# Patient Record
Sex: Female | Born: 1970 | Race: White | Hispanic: No | Marital: Single | Smoking: Never smoker
Health system: Southern US, Community
[De-identification: ages and names within clinical notes are randomized; demographics above are authoritative.]

## PROBLEM LIST (undated history)

## (undated) DIAGNOSIS — K219 Gastro-esophageal reflux disease without esophagitis: Secondary | ICD-10-CM

## (undated) DIAGNOSIS — M797 Fibromyalgia: Secondary | ICD-10-CM

## (undated) DIAGNOSIS — M1990 Unspecified osteoarthritis, unspecified site: Secondary | ICD-10-CM

## (undated) DIAGNOSIS — R5383 Other fatigue: Secondary | ICD-10-CM

## (undated) DIAGNOSIS — I10 Essential (primary) hypertension: Secondary | ICD-10-CM

## (undated) DIAGNOSIS — M722 Plantar fascial fibromatosis: Secondary | ICD-10-CM

## (undated) HISTORY — DX: Fibromyalgia: M79.7

## (undated) HISTORY — DX: Other fatigue: R53.83

## (undated) HISTORY — DX: Unspecified osteoarthritis, unspecified site: M19.90

## (undated) HISTORY — DX: Essential (primary) hypertension: I10

## (undated) HISTORY — PX: FOOT SURGERY: SHX648

## (undated) HISTORY — DX: Plantar fascial fibromatosis: M72.2

## (undated) HISTORY — DX: Gastro-esophageal reflux disease without esophagitis: K21.9

---

## 2006-06-20 ENCOUNTER — Ambulatory Visit: Payer: Self-pay | Admitting: Cardiology

## 2006-06-27 ENCOUNTER — Ambulatory Visit: Payer: Self-pay | Admitting: Cardiology

## 2006-09-07 ENCOUNTER — Ambulatory Visit: Payer: Self-pay | Admitting: Cardiology

## 2006-09-11 ENCOUNTER — Ambulatory Visit: Payer: Self-pay | Admitting: Cardiovascular Disease

## 2006-09-11 ENCOUNTER — Inpatient Hospital Stay (HOSPITAL_BASED_OUTPATIENT_CLINIC_OR_DEPARTMENT_OTHER): Admission: RE | Admit: 2006-09-11 | Discharge: 2006-09-11 | Payer: Self-pay | Admitting: Internal Medicine

## 2006-09-21 ENCOUNTER — Ambulatory Visit: Payer: Self-pay | Admitting: Cardiology

## 2009-08-18 ENCOUNTER — Ambulatory Visit: Payer: Self-pay | Admitting: Cardiology

## 2016-09-14 ENCOUNTER — Telehealth (INDEPENDENT_AMBULATORY_CARE_PROVIDER_SITE_OTHER): Payer: Self-pay | Admitting: Radiology

## 2016-09-14 NOTE — Telephone Encounter (Signed)
[facility] Calling about prescription written back in August for ambien 10mg . The prescription was suppose to be under Dr. Bronson Curb no [place] PA-C. Advised looking in old EMR, I can see where this was approved by Dr.Deveshwar and message was taken by Amy.

## 2017-01-03 DIAGNOSIS — F5101 Primary insomnia: Secondary | ICD-10-CM | POA: Insufficient documentation

## 2017-01-03 DIAGNOSIS — M722 Plantar fascial fibromatosis: Secondary | ICD-10-CM | POA: Insufficient documentation

## 2017-01-03 DIAGNOSIS — R5383 Other fatigue: Secondary | ICD-10-CM | POA: Insufficient documentation

## 2017-01-03 DIAGNOSIS — M797 Fibromyalgia: Secondary | ICD-10-CM | POA: Insufficient documentation

## 2017-01-03 NOTE — Progress Notes (Signed)
Office Visit Note  Patient: Katie Branch             Date of Birth: [birth year]           MRN: [id number]             PCP: Monico Blitz, MD Referring: No ref. provider found Visit Date: 01/06/2017 Occupation: @GUAROCC @    Subjective:  Knee pain.   History of Present Illness: Katie Branch is a 46 y.o. female with history of fibromyalgia syndrome. She states she's been having increased discomfort in the last few weeks. She describes pain in bilateral knee joints and behind her knees. She's been also having discomfort in her bilateral lower extremities. She complains of increased neck pain and trapezius pain. She's been having increased migraine headaches. She's been noticing some numbness in her right hand. She denies any joint swelling. She describes her pain on 0-10 about 6 and fatigue about 8.   Activities of Daily Living:  Patient reports morning stiffness for 1 hour.   Patient Reports nocturnal pain.  Difficulty dressing/grooming: Denies Difficulty climbing stairs: Reports Difficulty getting out of chair: Reports Difficulty using hands for taps, buttons, cutlery, and/or writing: Denies   Review of Systems  Constitutional: Positive for fatigue. Negative for night sweats, weight gain, weight loss and weakness.  HENT: Positive for mouth dryness. Negative for mouth sores, trouble swallowing, trouble swallowing and nose dryness.   Eyes: Negative for pain, redness, visual disturbance and dryness.  Respiratory: Negative for cough, shortness of breath and difficulty breathing.   Cardiovascular: Negative for chest pain, palpitations, hypertension, irregular heartbeat and swelling in legs/feet.  Gastrointestinal: Negative for blood in stool, constipation and diarrhea.  Endocrine: Negative for increased urination.  Genitourinary: Negative for vaginal dryness.  Musculoskeletal: Positive for arthralgias, joint pain, myalgias, morning stiffness and myalgias. Negative for joint swelling,  muscle weakness and muscle tenderness.  Skin: Negative for color change, rash, hair loss, skin tightness, ulcers and sensitivity to sunlight.  Allergic/Immunologic: Negative for susceptible to infections.  Neurological: Positive for headaches. Negative for dizziness, memory loss and night sweats.  Hematological: Negative for swollen glands.  Psychiatric/Behavioral: Positive for depressed mood and sleep disturbance. The patient is nervous/anxious.     PMFS History:  Patient Active Problem List   Diagnosis Date Noted  . Essential hypertension 01/04/2017  . Gastroesophageal reflux disease without esophagitis 01/04/2017  . Fibromyalgia 01/03/2017  . Other fatigue 01/03/2017  . Primary insomnia 01/03/2017  . Plantar fasciitis 01/03/2017    Past Medical History:  Diagnosis Date  . Fatigue   . Fibromyalgia   . GERD (gastroesophageal reflux disease)   . Hypertension   . Plantar fasciitis     No family history on file. Past Surgical History:  Procedure Laterality Date  . FOOT SURGERY     Social History   Social History Narrative  . No narrative on file     Objective: Vital Signs: BP 116/74   Pulse 78   Resp 14   Ht 5\' 5"  (1.651 m)   Wt 206 lb (93.4 kg)   LMP 01/02/2017 (Exact Date)   BMI 34.28 kg/m    Physical Exam  Constitutional: She is oriented to person, place, and time. She appears well-developed and well-nourished.  HENT:  Head: Normocephalic and atraumatic.  Eyes: Conjunctivae and EOM are normal.  Neck: Normal range of motion.  Cardiovascular: Normal rate, regular rhythm, normal heart sounds and intact distal pulses.   Pulmonary/Chest: Effort normal and breath sounds normal.  Abdominal: Soft. Bowel sounds are normal.  Lymphadenopathy:    She has no cervical adenopathy.  Neurological: She is alert and oriented to person, place, and time.  Skin: Skin is warm and dry. Capillary refill takes less than 2 seconds.  Psychiatric: She has a normal mood and affect.  Her behavior is normal.  Nursing note and vitals reviewed.    Musculoskeletal Exam: She is painful range of motion of her C-spine. Thoracic lumbar spine good range of motion no SI joint tenderness. She also had a spasm in bilateral trapezius area. Shoulder joints, elbow joints, wrist joints, MCPs PIPs DIPs with good range of motion with no synovitis. Hip joints are good range of motion. She is painful range of motion of bilateral knee joints without any warmth swelling or effusion. Ankle joints MTPs PIPs DIPs were good range of motion with no synovitis.  CDAI Exam: No CDAI exam completed.    Investigation: Findings:  August 2014:  Sed rate was 6.  Rheumatoid factor 15.3 which was slightly elevated.  CBC, comprehensive metabolic panel, and TSH were normal.     08/01/2013 We obtained x-rays of bilateral hands, AP and oblique view which showed bilateral minimal PIP narrowing.  No MCP changes, no erosive changes were noted.  Bilateral feet x-rays showed bilateral minimal PIP, DIP narrowing and left small inferior calcaneal spur on the heel.   Labs from 08/01/2013 show TB Gold is negative.  Hep panel is negative.  CCP is negative.  Immunoglobulins are normal.  G6PD is normal.  CK is normal at 104.  ANA is negative.  SPEP, M-spike is negative.  Urinalysis is normal.    03/12/2014 Today, we obtained ultrasound examination of bilateral hands to look for any underlying synovitis or tenosynovitis.  After informed consent was obtained per EULAR recommendation, ultrasound examination of bilateral hands was performed.  Using gray scale, 12 MHz transducer and power Doppler, bilateral 2nd, 3rd and 5th MCP joint and bilateral wrist joint both dorsal and volar aspects were evaluated.  The findings were that she had no synovitis in her hands or wrist joint.  Right median nerve was 0.09 cm squared and left median nerve was 0.10 cm squared.  These were within normal limits.      Imaging: Xr Cervical Spine 2 Or 3  Views  Result Date: 01/06/2017 Mild posterior spurring noted no significant disc space narrowing was noted.  Xr Knee 3 View Left  Result Date: 01/06/2017 Moderate medial compartment narrowing with intercondylar osteophytes. No chondrocalcinosis was noted. No patellofemoral narrowing was noted. Impression: Findings consistent with moderate osteoarthritis of the knee joint.  Xr Knee 3 View Right  Result Date: 01/06/2017 Moderate medial compartment narrowing with intercondylar osteophytes. No chondrocalcinosis was noted. No patellofemoral narrowing was noted. Impression: Findings consistent with moderate osteoarthritis of the knee joint.   Speciality Comments: No specialty comments available.    Procedures:  Trigger Point Inj Date/Time: 01/06/2017 12:33 PM Performed by: Bo Merino Authorized by: Bo Merino   Consent Given by:  Patient Site marked: the procedure site was marked   Timeout: prior to procedure the correct patient, procedure, and site was verified   Indications:  Muscle spasm and pain Total # of Trigger Points:  2 Location: neck   Location comment:  Bilateral trapezius Needle Size:  27 G Approach:  Dorsal Medications #1:  0.5 mL lidocaine 1 % Medications #2:  10 mg triamcinolone acetonide 40 MG/ML Patient tolerance:  Patient tolerated the procedure well with no immediate complications  Large Joint Inj Date/Time: 01/06/2017 12:34 PM Performed by: Bo Merino Authorized by: Bo Merino   Consent Given by:  Patient Site marked: the procedure site was marked   Timeout: prior to procedure the correct patient, procedure, and site was verified   Indications:  Pain and joint swelling Location:  Knee Site:  R knee Prep: patient was prepped and draped in usual sterile fashion   Needle Size:  27 G Needle Length:  1.5 inches Approach:  Medial Ultrasound Guidance: No   Fluoroscopic Guidance: No   Arthrogram: No   Medications:  1.5 mL lidocaine 1  %; 40 mg triamcinolone acetonide 40 MG/ML Aspiration Attempted: Yes   Aspirate amount (mL):  0 Patient tolerance:  Patient tolerated the procedure well with no immediate complications   Allergies: Penicillins   Assessment / Plan:     Visit Diagnoses: Fibromyalgia - treated by PCP Dr Manuella Ghazi . She's been complaining of increased pain lately.  Other fatigue: She continues to have increased fatigue as well.  Primary insomnia: She has chronic insomnia, good sleep hygiene was discussed.  Chronic pain of both knees - increased knee joint pain. Plan: XR KNEE 3 VIEW RIGHT, XR KNEE 3 VIEW LEFT. She has mild to moderate osteoarthritis of bilateral knee joints on the x-ray. Different treatment options and side effects were discussed. Patient requested right knee joint cortisone injection. After informed consent was obtained the right knee joint was injected with cortisone as described above by Mr. Carlyon Shadow she tolerated the procedure well.  Neck pain -increased neck pain and stiffness, increase in headaches and right hand numbness Plan: XR Cervical Spine 2 or 3 views. The x-ray did not reveal significant disc disease on examination today . She was having a lot of trapezius spasm per request bilateral trapezius trigger points were injected with cortisone as described above she tolerated the procedure well.  Essential hypertension  Gastroesophageal reflux disease without esophagitis    Orders: Orders Placed This Encounter  Procedures  . XR Cervical Spine 2 or 3 views  . XR KNEE 3 VIEW RIGHT  . XR KNEE 3 VIEW LEFT   No orders of the defined types were placed in this encounter.   Face-to-face time spent with patient was 30 minutes. 50% of time was spent in counseling and coordination of care.  Follow-Up Instructions: Return in about 6 months (around 07/09/2017) for Fibromyalgia, joint pain.   Bo Merino, MD  Note - This record has been created using Editor, commissioning.  Chart creation errors  have been sought, but may not always  have been located. Such creation errors do not reflect on  the standard of medical care.

## 2017-01-04 DIAGNOSIS — I10 Essential (primary) hypertension: Secondary | ICD-10-CM | POA: Insufficient documentation

## 2017-01-04 DIAGNOSIS — K219 Gastro-esophageal reflux disease without esophagitis: Secondary | ICD-10-CM | POA: Insufficient documentation

## 2017-01-06 ENCOUNTER — Ambulatory Visit (INDEPENDENT_AMBULATORY_CARE_PROVIDER_SITE_OTHER): Payer: Self-pay

## 2017-01-06 ENCOUNTER — Encounter: Payer: Self-pay | Admitting: Rheumatology

## 2017-01-06 ENCOUNTER — Ambulatory Visit (INDEPENDENT_AMBULATORY_CARE_PROVIDER_SITE_OTHER): Payer: BLUE CROSS/BLUE SHIELD | Admitting: Rheumatology

## 2017-01-06 VITALS — BP 116/74 | HR 78 | Resp 14 | Ht 65.0 in | Wt 206.0 lb

## 2017-01-06 DIAGNOSIS — M25562 Pain in left knee: Secondary | ICD-10-CM | POA: Diagnosis not present

## 2017-01-06 DIAGNOSIS — F5101 Primary insomnia: Secondary | ICD-10-CM

## 2017-01-06 DIAGNOSIS — M542 Cervicalgia: Secondary | ICD-10-CM | POA: Diagnosis not present

## 2017-01-06 DIAGNOSIS — K219 Gastro-esophageal reflux disease without esophagitis: Secondary | ICD-10-CM | POA: Diagnosis not present

## 2017-01-06 DIAGNOSIS — M797 Fibromyalgia: Secondary | ICD-10-CM

## 2017-01-06 DIAGNOSIS — G8929 Other chronic pain: Secondary | ICD-10-CM | POA: Diagnosis not present

## 2017-01-06 DIAGNOSIS — M25561 Pain in right knee: Secondary | ICD-10-CM

## 2017-01-06 DIAGNOSIS — R5383 Other fatigue: Secondary | ICD-10-CM | POA: Diagnosis not present

## 2017-01-06 DIAGNOSIS — I10 Essential (primary) hypertension: Secondary | ICD-10-CM

## 2017-01-06 MED ORDER — TRIAMCINOLONE ACETONIDE 40 MG/ML IJ SUSP
40.0000 mg | INTRAMUSCULAR | Status: AC | PRN
  Administered 2017-01-06: 40 mg via INTRA_ARTICULAR

## 2017-01-06 MED ORDER — TRIAMCINOLONE ACETONIDE 40 MG/ML IJ SUSP
10.0000 mg | INTRAMUSCULAR | Status: AC | PRN
  Administered 2017-01-06: 10 mg via INTRAMUSCULAR

## 2017-01-06 MED ORDER — LIDOCAINE HCL 1 % IJ SOLN
0.5000 mL | INTRAMUSCULAR | Status: AC | PRN
  Administered 2017-01-06: .5 mL

## 2017-01-06 MED ORDER — LIDOCAINE HCL 1 % IJ SOLN
1.5000 mL | INTRAMUSCULAR | Status: AC | PRN
  Administered 2017-01-06: 1.5 mL

## 2017-01-06 MED ORDER — ZOLPIDEM TARTRATE 5 MG PO TABS: 5.0000 mg | ORAL_TABLET | Freq: Every evening | ORAL | 3 refills | Status: DC | PRN

## 2017-05-07 ENCOUNTER — Other Ambulatory Visit: Payer: Self-pay | Admitting: Rheumatology

## 2017-05-08 NOTE — Telephone Encounter (Signed)
ok 

## 2017-05-08 NOTE — Telephone Encounter (Signed)
Last Visit: 01/06/17 Next Visit: 07/13/17  Okay to refill Ambien?

## 2017-05-09 ENCOUNTER — Other Ambulatory Visit: Payer: Self-pay | Admitting: Rheumatology

## 2017-05-11 ENCOUNTER — Other Ambulatory Visit: Payer: Self-pay | Admitting: Rheumatology

## 2017-07-05 NOTE — Progress Notes (Signed)
Office Visit Note  Patient: Katie Branch             Date of Birth: November 09, 1970           MRN: [id number]             PCP: Monico Blitz, MD Referring: Monico Blitz, MD Visit Date: 07/13/2017 Occupation: @GUAROCC @    Subjective:  Medication Management increased pain.   History of Present Illness: Katie Branch is a 46 y.o. female with history of fibromyalgia syndrome. She states she's been experiencing increased pain and discomfort all over. She's been having pain in the anterior part of her knee especially when she sleeps at night. She's having a flare of her fibromyalgia. She complains of pain around her neck and her lower back. She has not had any recent problems with plantar fasciitis.  Activities of Daily Living:  Patient reports morning stiffness for 0 minute.   Patient Denies nocturnal pain.  Difficulty dressing/grooming: Denies Difficulty climbing stairs: Denies Difficulty getting out of chair: Reports Difficulty using hands for taps, buttons, cutlery, and/or writing: Denies   Review of Systems  Constitutional: Positive for fatigue. Negative for night sweats, weight gain, weight loss and weakness.  HENT: Negative for mouth sores, trouble swallowing, trouble swallowing, mouth dryness and nose dryness.   Eyes: Negative for pain, redness, visual disturbance and dryness.  Respiratory: Negative for cough, shortness of breath and difficulty breathing.   Cardiovascular: Negative for chest pain, palpitations, hypertension, irregular heartbeat and swelling in legs/feet.  Gastrointestinal: Negative for blood in stool, constipation and diarrhea.  Endocrine: Negative for increased urination.  Genitourinary: Negative for vaginal dryness.  Musculoskeletal: Positive for arthralgias, joint pain, myalgias and myalgias. Negative for joint swelling, muscle weakness, morning stiffness and muscle tenderness.  Skin: Negative for color change, rash, hair loss, skin tightness, ulcers and  sensitivity to sunlight.  Allergic/Immunologic: Negative for susceptible to infections.  Neurological: Negative for dizziness, memory loss and night sweats.  Hematological: Negative for swollen glands.  Psychiatric/Behavioral: Positive for depressed mood. Negative for sleep disturbance. The patient is nervous/anxious.     PMFS History:  Patient Active Problem List   Diagnosis Date Noted  . Essential hypertension 01/04/2017  . Gastroesophageal reflux disease without esophagitis 01/04/2017  . Fibromyalgia 01/03/2017  . Other fatigue 01/03/2017  . Primary insomnia 01/03/2017  . Plantar fasciitis 01/03/2017    Past Medical History:  Diagnosis Date  . Fatigue   . Fibromyalgia   . GERD (gastroesophageal reflux disease)   . Hypertension   . Plantar fasciitis     No family history on file. Past Surgical History:  Procedure Laterality Date  . FOOT SURGERY     Social History   Social History Narrative  . No narrative on file     Objective: Vital Signs: BP 130/76   Resp 16   Ht 5\' 5"  (1.651 m)   Wt 210 lb (95.3 kg)   LMP 06/20/2017   BMI 34.95 kg/m    Physical Exam  Constitutional: She is oriented to person, place, and time. She appears well-developed and well-nourished.  HENT:  Head: Normocephalic and atraumatic.  Eyes: Conjunctivae and EOM are normal.  Neck: Normal range of motion.  Cardiovascular: Normal rate, regular rhythm, normal heart sounds and intact distal pulses.   Pulmonary/Chest: Effort normal and breath sounds normal.  Abdominal: Soft. Bowel sounds are normal.  Lymphadenopathy:    She has no cervical adenopathy.  Neurological: She is alert and oriented to person, place,  and time.  Skin: Skin is warm and dry. Capillary refill takes less than 2 seconds.  Psychiatric: She has a normal mood and affect. Her behavior is normal.  Nursing note and vitals reviewed.    Musculoskeletal Exam: C-spine and thoracic lumbar spine good range of motion. She is some  discomfort with range of motion of her C-spine and lumbar spine. Shoulder joints elbow joints wrist joint MCPs PIPs DIPs with good range of motion with no synovitis. Hip joints knee joints ankles MTPs PIPs DIPs with good range of motion with no synovitis. Fibromyalgia tender points were 11 out of 18 positive.  CDAI Exam: No CDAI exam completed.    Investigation: No additional findings.   Imaging: No results found.  Speciality Comments: No specialty comments available.    Procedures:  No procedures performed Allergies: Penicillins   Assessment / Plan:     Visit Diagnoses: Fibromyalgia - treated by PCP Dr Manuella Ghazi. She continues to have some generalized pain and discomfort and had a recent flare.  Other fatigue: She's having fatigue due to increased pain recently.  Primary insomnia: She is on Ambien which controlled her symptoms quite well. Good sleep hygiene was also discussed.  Neck pain I offered bilateral trapezius injection which she declined. I've given her handout on neck exercises. She declined physical therapy.  Lower back pain: She has no point tenderness and no radiculopathy. I've given her some back exercises as well. She's been exercising and doing water aerobics I believe that would be helpful long-term.  Chronic pain of both knees: Appears to be related to fibromyalgia.  History of hypertension  History of gastroesophageal reflux (GERD)    Orders: No orders of the defined types were placed in this encounter.  No orders of the defined types were placed in this encounter.    Follow-Up Instructions: Return in about 6 months (around 01/10/2018) for FMS.   Bo Merino, MD  Note - This record has been created using Editor, commissioning.  Chart creation errors have been sought, but may not always  have been located. Such creation errors do not reflect on  the standard of medical care.

## 2017-07-13 ENCOUNTER — Encounter: Payer: Self-pay | Admitting: Rheumatology

## 2017-07-13 ENCOUNTER — Ambulatory Visit (INDEPENDENT_AMBULATORY_CARE_PROVIDER_SITE_OTHER): Payer: BLUE CROSS/BLUE SHIELD | Admitting: Rheumatology

## 2017-07-13 VITALS — BP 130/76 | Resp 16 | Ht 65.0 in | Wt 210.0 lb

## 2017-07-13 DIAGNOSIS — Z8679 Personal history of other diseases of the circulatory system: Secondary | ICD-10-CM

## 2017-07-13 DIAGNOSIS — F5101 Primary insomnia: Secondary | ICD-10-CM

## 2017-07-13 DIAGNOSIS — G8929 Other chronic pain: Secondary | ICD-10-CM | POA: Diagnosis not present

## 2017-07-13 DIAGNOSIS — M25562 Pain in left knee: Secondary | ICD-10-CM | POA: Diagnosis not present

## 2017-07-13 DIAGNOSIS — M542 Cervicalgia: Secondary | ICD-10-CM | POA: Diagnosis not present

## 2017-07-13 DIAGNOSIS — Z8719 Personal history of other diseases of the digestive system: Secondary | ICD-10-CM

## 2017-07-13 DIAGNOSIS — M797 Fibromyalgia: Secondary | ICD-10-CM | POA: Diagnosis not present

## 2017-07-13 DIAGNOSIS — R5383 Other fatigue: Secondary | ICD-10-CM | POA: Diagnosis not present

## 2017-07-13 DIAGNOSIS — M25561 Pain in right knee: Secondary | ICD-10-CM | POA: Diagnosis not present

## 2017-07-13 NOTE — Patient Instructions (Signed)
Back Exercises The following exercises strengthen the muscles that help to support the back. They also help to keep the lower back flexible. Doing these exercises can help to prevent back pain or lessen existing pain. If you have back pain or discomfort, try doing these exercises 2-3 times each day or as told by your health care provider. When the pain goes away, do them once each day, but increase the number of times that you repeat the steps for each exercise (do more repetitions). If you do not have back pain or discomfort, do these exercises once each day or as told by your health care provider. Exercises Single Knee to Chest  Repeat these steps 3-5 times for each leg: 1. Lie on your back on a firm bed or the floor with your legs extended. 2. Bring one knee to your chest. Your other leg should stay extended and in contact with the floor. 3. Hold your knee in place by grabbing your knee or thigh. 4. Pull on your knee until you feel a gentle stretch in your lower back. 5. Hold the stretch for 10-30 seconds. 6. Slowly release and straighten your leg.  Pelvic Tilt  Repeat these steps 5-10 times: 1. Lie on your back on a firm bed or the floor with your legs extended. 2. Bend your knees so they are pointing toward the ceiling and your feet are flat on the floor. 3. Tighten your lower abdominal muscles to press your lower back against the floor. This motion will tilt your pelvis so your tailbone points up toward the ceiling instead of pointing to your feet or the floor. 4. With gentle tension and even breathing, hold this position for 5-10 seconds.  Cat-Cow  Repeat these steps until your lower back becomes more flexible: 1. Get into a hands-and-knees position on a firm surface. Keep your hands under your shoulders, and keep your knees under your hips. You may place padding under your knees for comfort. 2. Let your head hang down, and point your tailbone toward the floor so your lower back  becomes rounded like the back of a cat. 3. Hold this position for 5 seconds. 4. Slowly lift your head and point your tailbone up toward the ceiling so your back forms a sagging arch like the back of a cow. 5. Hold this position for 5 seconds.  Press-Ups  Repeat these steps 5-10 times: 1. Lie on your abdomen (face-down) on the floor. 2. Place your palms near your head, about shoulder-width apart. 3. While you keep your back as relaxed as possible and keep your hips on the floor, slowly straighten your arms to raise the top half of your body and lift your shoulders. Do not use your back muscles to raise your upper torso. You may adjust the placement of your hands to make yourself more comfortable. 4. Hold this position for 5 seconds while you keep your back relaxed. 5. Slowly return to lying flat on the floor.  Bridges  Repeat these steps 10 times: 1. Lie on your back on a firm surface. 2. Bend your knees so they are pointing toward the ceiling and your feet are flat on the floor. 3. Tighten your buttocks muscles and lift your buttocks off of the floor until your waist is at almost the same height as your knees. You should feel the muscles working in your buttocks and the back of your thighs. If you do not feel these muscles, slide your feet 1-2 inches farther away   off of the floor until your waist is at almost the same height as your knees. You should feel the muscles working in your buttocks and the back of your thighs. If you do not feel these muscles, slide your feet 1-2 inches farther away from your buttocks.  4. Hold this position for 3-5 seconds.  5. Slowly lower your hips to the starting position, and allow your buttocks muscles to relax completely.    If this exercise is too easy, try doing it with your arms crossed over your chest.  Abdominal Crunches    Repeat these steps 5-10 times:  1. Lie on your back on a firm bed or the floor with your legs extended.  2. Bend your knees so they are pointing toward the ceiling and your feet are flat on the floor.  3. Cross your arms over your chest.  4. Tip your chin slightly toward your chest without bending your neck.  5. Tighten your abdominal muscles and slowly raise your  trunk (torso) high enough to lift your shoulder blades a tiny bit off of the floor. Avoid raising your torso higher than that, because it can put too much stress on your low back and it does not help to strengthen your abdominal muscles.  6. Slowly return to your starting position.    Back Lifts  Repeat these steps 5-10 times:  1. Lie on your abdomen (face-down) with your arms at your sides, and rest your forehead on the floor.  2. Tighten the muscles in your legs and your buttocks.  3. Slowly lift your chest off of the floor while you keep your hips pressed to the floor. Keep the back of your head in line with the curve in your back. Your eyes should be looking at the floor.  4. Hold this position for 3-5 seconds.  5. Slowly return to your starting position.    Contact a health care provider if:   Your back pain or discomfort gets much worse when you do an exercise.   Your back pain or discomfort does not lessen within 2 hours after you exercise.  If you have any of these problems, stop doing these exercises right away. Do not do them again unless your health care provider says that you can.  Get help right away if:   You develop sudden, severe back pain. If this happens, stop doing the exercises right away. Do not do them again unless your health care provider says that you can.  This information is not intended to replace advice given to you by your health care provider. Make sure you discuss any questions you have with your health care provider.  Document Released: 11/17/2004 Document Revised: 02/17/2016 Document Reviewed: 12/04/2014  Elsevier Interactive Patient Education  2017 [employer]    Cervical Strain and Sprain Rehab  Ask your health care provider which exercises are safe for you. Do exercises exactly as told by your health care provider and adjust them as directed. It is normal to feel mild stretching, pulling, tightness, or discomfort as you do these exercises, but you should stop right away if  you feel sudden pain or your pain gets worse.Do not begin these exercises until told by your health care provider.  Stretching and range of motion exercises  These exercises warm up your muscles and joints and improve the movement and flexibility of your neck. These exercises also help to relieve pain, numbness, and tingling.  Exercise A: Cervical side bend      7. Using good posture, sit on a stable chair or stand up.  8. Without moving your shoulders, slowly tilt your left / right ear to your shoulder until you feel a stretch in your neck muscles. You should be looking straight ahead.  9. Hold for __________ seconds.  10. Repeat with the other side of your neck.  Repeat __________ times. Complete this exercise __________ times a day.  Exercise B: Cervical rotation    1. Using good posture, sit on a stable chair or stand up.  2. Slowly turn your head to the side as if you are looking over your left / right shoulder.  ? Keep your eyes level with the ground.  ? Stop when you feel a stretch along the side and the back of your neck.  3. Hold for __________ seconds.  4. Repeat this by turning to your other side.  Repeat __________ times. Complete this exercise __________ times a day.  Exercise C: Thoracic extension and pectoral stretch  6. Roll a towel or a small blanket so it is about 4 inches (10 cm) in diameter.  7. Lie down on your back on a firm surface.  8. Put the towel lengthwise, under your spine in the middle of your back. It should not be not under your shoulder blades. The towel should line up with your spine from your middle back to your lower back.  9. Put your hands behind your head and let your elbows fall out to your sides.  10. Hold for __________ seconds.  Repeat __________ times. Complete this exercise __________ times a day.  Strengthening exercises  These exercises build strength and endurance in your neck. Endurance is the ability to use your muscles for a long time, even after your muscles get  tired.  Exercise D: Upper cervical flexion, isometric  6. Lie on your back with a thin pillow behind your head and a small rolled-up towel under your neck.  7. Gently tuck your chin toward your chest and nod your head down to look toward your feet. Do not lift your head off the pillow.  8. Hold for __________ seconds.  9. Release the tension slowly. Relax your neck muscles completely before you repeat this exercise.  Repeat __________ times. Complete this exercise __________ times a day.  Exercise E: Cervical extension, isometric    6. Stand about 6 inches (15 cm) away from a wall, with your back facing the wall.  7. Place a soft object, about 6-8 inches (15-20 cm) in diameter, between the back of your head and the wall. A soft object could be a small pillow, a ball, or a folded towel.  8. Gently tilt your head back and press into the soft object. Keep your jaw and forehead relaxed.  9. Hold for __________ seconds.  10. Release the tension slowly. Relax your neck muscles completely before you repeat this exercise.  Repeat __________ times. Complete this exercise __________ times a day.  Posture and body mechanics    Body mechanics refers to the movements and positions of your body while you do your daily activities. Posture is part of body mechanics. Good posture and healthy body mechanics can help to relieve stress in your body's tissues and joints. Good posture means that your spine is in its natural S-curve position (your spine is neutral), your shoulders are pulled back slightly, and your head is not tipped forward. The following are general guidelines for applying improved posture and body mechanics to your   everyday activities.  Standing   When standing, keep your spine neutral and keep your feet about hip-width apart. Keep a slight bend in your knees. Your ears, shoulders, and hips should line up.   When you do a task in which you stand in one place for a long time, place one foot up on a stable object that  is 2-4 inches (5-10 cm) high, such as a footstool. This helps keep your spine neutral.  Sitting     When sitting, keep your spine neutral and your keep feet flat on the floor. Use a footrest, if necessary, and keep your thighs parallel to the floor. Avoid rounding your shoulders, and avoid tilting your head forward.   When working at a desk or a computer, keep your desk at a height where your hands are slightly lower than your elbows. Slide your chair under your desk so you are close enough to maintain good posture.   When working at a computer, place your monitor at a height where you are looking straight ahead and you do not have to tilt your head forward or downward to look at the screen.  Resting  When lying down and resting, avoid positions that are most painful for you. Try to support your neck in a neutral position. You can use a contour pillow or a small rolled-up towel. Your pillow should support your neck but not push on it.  This information is not intended to replace advice given to you by your health care provider. Make sure you discuss any questions you have with your health care provider.  Document Released: 10/10/2005 Document Revised: 06/16/2016 Document Reviewed: 09/16/2015  Elsevier Interactive Patient Education  2018 [employer]

## 2017-07-28 ENCOUNTER — Telehealth: Payer: Self-pay | Admitting: Rheumatology

## 2017-07-28 NOTE — Telephone Encounter (Addendum)
Patient left a message 10/4 requesting status of letter for [employer] license, and Ambien rx. Please call to advise. Patient needs letter today if possible. She is starting class on Monday.

## 2017-07-31 ENCOUNTER — Encounter: Payer: Self-pay | Admitting: Rheumatology

## 2017-07-31 ENCOUNTER — Encounter: Payer: Self-pay | Admitting: *Deleted

## 2017-07-31 NOTE — Telephone Encounter (Signed)
Per Dr. Estanislado Pandy unable to write letter for patient.

## 2017-12-28 NOTE — Progress Notes (Deleted)
   Office Visit Note  Patient: Katie Branch             Date of Birth: July 04, 1971           MRN: [id number]             PCP: Monico Blitz, MD Referring: Monico Blitz, MD Visit Date: 01/11/2018 Occupation: @GUAROCC @    Subjective:  No chief complaint on file.   History of Present Illness: Katie Branch is a 47 y.o. female ***   Activities of Daily Living:  Patient reports morning stiffness for *** {minute/hour:19697}.   Patient {ACTIONS;DENIES/REPORTS:21021675::"Denies"} nocturnal pain.  Difficulty dressing/grooming: {ACTIONS;DENIES/REPORTS:21021675::"Denies"} Difficulty climbing stairs: {ACTIONS;DENIES/REPORTS:21021675::"Denies"} Difficulty getting out of chair: {ACTIONS;DENIES/REPORTS:21021675::"Denies"} Difficulty using hands for taps, buttons, cutlery, and/or writing: {ACTIONS;DENIES/REPORTS:21021675::"Denies"}   No Rheumatology ROS completed.   PMFS History:  Patient Active Problem List   Diagnosis Date Noted  . Essential hypertension 01/04/2017  . Gastroesophageal reflux disease without esophagitis 01/04/2017  . Fibromyalgia 01/03/2017  . Other fatigue 01/03/2017  . Primary insomnia 01/03/2017  . Plantar fasciitis 01/03/2017    Past Medical History:  Diagnosis Date  . Fatigue   . Fibromyalgia   . GERD (gastroesophageal reflux disease)   . Hypertension   . Plantar fasciitis     No family history on file. Past Surgical History:  Procedure Laterality Date  . FOOT SURGERY     Social History   Social History Narrative  . Not on file     Objective: Vital Signs: There were no vitals taken for this visit.   Physical Exam   Musculoskeletal Exam: ***  CDAI Exam: No CDAI exam completed.    Investigation: No additional findings.   Imaging: No results found.  Speciality Comments: No specialty comments available.    Procedures:  No procedures performed Allergies: Penicillins   Assessment / Plan:     Visit Diagnoses: No diagnosis found.     Orders: No orders of the defined types were placed in this encounter.  No orders of the defined types were placed in this encounter.   Face-to-face time spent with patient was *** minutes. 50% of time was spent in counseling and coordination of care.  Follow-Up Instructions: No Follow-up on file.   Earnestine Mealing, CMA  Note - This record has been created using Editor, commissioning.  Chart creation errors have been sought, but may not always  have been located. Such creation errors do not reflect on  the standard of medical care.

## 2018-01-11 ENCOUNTER — Ambulatory Visit: Payer: BLUE CROSS/BLUE SHIELD | Admitting: Rheumatology

## 2018-01-23 NOTE — Progress Notes (Signed)
Office Visit Note  Patient: Katie Branch             Date of Birth: 05/25/1971           MRN: [id number]             PCP: Monico Blitz, MD Referring: Monico Blitz, MD Visit Date: 02/06/2018 Occupation: @GUAROCC @    Subjective:  Bilateral SI joint pain   History of Present Illness: Katie Branch is a 47 y.o. female with history of fibromyalgia.  Patient denies any recent flares of her fibromyalgia.  She states she continues to take Cymbalta 60 mg daily.  She states she has been taking baclofen on a as needed basis.  She states that she takes Aleve for pain relief.  She states that over the past few weeks she is having increased pain in her bilateral SI joints and lower back.  She denies any injuries.  She denies any radiation of pain she has no numbness or tingling.  She denies any muscle spasms.  She states the pain is worse with positional changes and standing for prolonged peers of time.  She states that her pain was severe last night and she had to take an Aleve for pain relief.  She states that her fatigue and insomnia have improved.  She states that she is no longer taking Ambien.  She states that she recently changed jobs and is more active.  She states that she has been having increased pain in her bilateral knees and bilateral hands.  She denies any joint swelling.  She states that her plantar fasciitis has resolved.     Activities of Daily Living:  Patient reports morning stiffness for 30 minutes.   Patient Reports nocturnal pain.  Difficulty dressing/grooming: Denies Difficulty climbing stairs: Denies Difficulty getting out of chair: Reports Difficulty using hands for taps, buttons, cutlery, and/or writing: Denies   Review of Systems  Constitutional: Positive for fatigue.  HENT: Negative for mouth sores, mouth dryness and nose dryness.   Eyes: Negative for pain, visual disturbance and dryness.  Respiratory: Negative for cough, hemoptysis, shortness of breath and  difficulty breathing.   Cardiovascular: Negative for chest pain, palpitations, hypertension and swelling in legs/feet.  Gastrointestinal: Negative for abdominal pain, blood in stool, constipation, diarrhea and nausea.  Endocrine: Negative for increased urination.  Genitourinary: Negative for painful urination and pelvic pain.  Musculoskeletal: Positive for arthralgias, joint pain and morning stiffness. Negative for joint swelling, myalgias, muscle weakness, muscle tenderness and myalgias.  Skin: Negative for color change, pallor, rash, hair loss, nodules/bumps, skin tightness, ulcers and sensitivity to sunlight.  Allergic/Immunologic: Negative for susceptible to infections.  Neurological: Negative for dizziness, numbness, headaches, memory loss and weakness.  Hematological: Negative for swollen glands.  Psychiatric/Behavioral: Negative for depressed mood and sleep disturbance. The patient is nervous/anxious.     PMFS History:  Patient Active Problem List   Diagnosis Date Noted  . Essential hypertension 01/04/2017  . Gastroesophageal reflux disease without esophagitis 01/04/2017  . Fibromyalgia 01/03/2017  . Other fatigue 01/03/2017  . Primary insomnia 01/03/2017  . Plantar fasciitis 01/03/2017    Past Medical History:  Diagnosis Date  . Fatigue   . Fibromyalgia   . GERD (gastroesophageal reflux disease)   . Hypertension   . Plantar fasciitis     Family History  Problem Relation Age of Onset  . Cancer Mother        pancreatic   . Heart disease Father   . COPD  Father    Past Surgical History:  Procedure Laterality Date  . FOOT SURGERY     Social History   Social History Narrative  . Not on file     Objective: Vital Signs: BP 116/80 (BP Location: Left Arm, Patient Position: Sitting, Cuff Size: Large)   Pulse 78   Resp 16   Ht 5\' 5"  (1.651 m)   Wt 205 lb 8 oz (93.2 kg)   BMI 34.20 kg/m    Physical Exam  Constitutional: She is oriented to person, place, and time.  She appears well-developed and well-nourished.  HENT:  Head: Normocephalic and atraumatic.  Eyes: Conjunctivae and EOM are normal.  Neck: Normal range of motion.  Cardiovascular: Normal rate, regular rhythm, normal heart sounds and intact distal pulses.  Pulmonary/Chest: Effort normal and breath sounds normal.  Abdominal: Soft. Bowel sounds are normal.  Lymphadenopathy:    She has no cervical adenopathy.  Neurological: She is alert and oriented to person, place, and time.  Skin: Skin is warm and dry. Capillary refill takes less than 2 seconds.  Psychiatric: She has a normal mood and affect. Her behavior is normal.  Nursing note and vitals reviewed.    Musculoskeletal Exam: C-spine, thoracic spine, lumbar spine good range of motion.  No midline spinal tenderness.  She has bilateral SI joint tenderness.  Shoulder joints, elbow joints, wrist joints, MCPs, PIPs, DIPs good range of motion with no synovitis.  Hip joints, knee joints, ankle joints, MTPs, PIPs, DIPs good range of motion with no synovitis.  No warmth or effusion of bilateral knees.  No knee crepitus.  No tenderness of plantar fascia.  No tenderness of bilateral trochanteric bursa.  CDAI Exam: No CDAI exam completed.    Investigation: No additional findings.   Imaging: Xr Lumbar Spine 2-3 Views  Result Date: 02/06/2018 Mild anterior spurring was noted.  No significant disc space narrowing was noted.  Facet joint arthropathy was noted.  SI joints appear normal. 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   Speciality Comments: No specialty comments available.    Procedures:  Sacroiliac Joint Inj on 02/06/2018 12:59 PM Indications: pain Details: 27 G 1.5 in needle, posterior approach Medications (Right): 1 mL lidocaine 1 %; 40 mg triamcinolone acetonide 40 MG/ML Aspirate (Right): 0 mL Medications (Left): 1 mL lidocaine 1 %; 40 mg triamcinolone acetonide  40 MG/ML Aspirate (Left): 0 mL Outcome: tolerated well, no immediate complications Procedure, treatment alternatives, risks and benefits explained, specific risks discussed. Consent was given by the patient. Immediately prior to procedure a time out was called to verify the correct patient, procedure, equipment, support staff and site/side marked as required. Patient was prepped and draped in the usual sterile fashion.     Allergies: Penicillins   Assessment / Plan:     Visit Diagnoses: Fibromyalgia: She has not had any recent flares of her fibromyalgia.  She continues to take Cymbalta 60 mg daily.  Refill sent to the pharmacy today.  She has been taking baclofen on a as needed basis.  She is no longer taking Ambien as a sleep aid.  Her insomnia and fatigue have improved significantly.  She has recently changed jobs and has become more active.  She does not have any generalized muscle aches or muscle tenderness at this time.  She is having bilateral SI joint pain and band-like lower back pain in the lumbar region.  An x-ray of her lumbar spine and bilateral  cortisone injections were provided today.  She tolerated the procedure well.  She was encouraged to continue to exercise on a regular basis.  Other fatigue improved.  She is more active at her new job.  Primary insomnia -improved.  She is no longer taking Ambien 5 mg at bedtime.   Chronic low back pain without sciatica, unspecified back pain laterality -she is having discomfort in her lower back.  She has tenderness in the bilateral SI joints.  She has mild tenderness in the midline of the lumbar region.  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  She requested cortisone injections of her bilateral SI joints.  She tolerated the procedure well.  She is advised to monitor her blood pressure closely following the cortisone injection today  Plantar fasciitis: Resolved.  Gastroesophageal  reflux disease without esophagitis  Essential hypertension: She is advised to monitor her blood pressure closely following the cortisone injection today.  .   Orders: Orders Placed This Encounter  Procedures  . XR Lumbar Spine 2-3 Views   Meds ordered this encounter  Medications  . DULoxetine (CYMBALTA) 60 MG capsule    Sig: Take 1 capsule (60 mg total) by mouth daily.    Dispense:  30 capsule    Refill:  2    Face-to-face time spent with patient was 30 minutes. >50% of time was spent in counseling and coordination of care.  Follow-Up Instructions: Return in about 6 months (around 08/08/2018) for Fibromyalgia.   Ofilia Neas, PA-C   I examined and evaluated the patient with Hazel Sams PA. The plan of care was discussed as noted above.  Bo Merino, MD  Note - This record has been created using Editor, commissioning.  Chart creation errors have been sought, but may not always  have been located. Such creation errors do not reflect on  the standard of medical care.

## 2018-02-06 ENCOUNTER — Ambulatory Visit (INDEPENDENT_AMBULATORY_CARE_PROVIDER_SITE_OTHER): Payer: Self-pay

## 2018-02-06 ENCOUNTER — Encounter: Payer: Self-pay | Admitting: Physician Assistant

## 2018-02-06 ENCOUNTER — Ambulatory Visit: Payer: BLUE CROSS/BLUE SHIELD | Admitting: Physician Assistant

## 2018-02-06 VITALS — BP 116/80 | HR 78 | Resp 16 | Ht 65.0 in | Wt 205.5 lb

## 2018-02-06 DIAGNOSIS — G8929 Other chronic pain: Secondary | ICD-10-CM | POA: Diagnosis not present

## 2018-02-06 DIAGNOSIS — M545 Low back pain: Secondary | ICD-10-CM | POA: Diagnosis not present

## 2018-02-06 DIAGNOSIS — M722 Plantar fascial fibromatosis: Secondary | ICD-10-CM

## 2018-02-06 DIAGNOSIS — M797 Fibromyalgia: Secondary | ICD-10-CM

## 2018-02-06 DIAGNOSIS — K219 Gastro-esophageal reflux disease without esophagitis: Secondary | ICD-10-CM

## 2018-02-06 DIAGNOSIS — I10 Essential (primary) hypertension: Secondary | ICD-10-CM | POA: Diagnosis not present

## 2018-02-06 DIAGNOSIS — F5101 Primary insomnia: Secondary | ICD-10-CM | POA: Diagnosis not present

## 2018-02-06 DIAGNOSIS — R5383 Other fatigue: Secondary | ICD-10-CM

## 2018-02-06 DIAGNOSIS — M533 Sacrococcygeal disorders, not elsewhere classified: Secondary | ICD-10-CM

## 2018-02-06 MED ORDER — LIDOCAINE HCL 1 % IJ SOLN
1.0000 mL | INTRAMUSCULAR | Status: AC | PRN
  Administered 2018-02-06: 1 mL

## 2018-02-06 MED ORDER — TRIAMCINOLONE ACETONIDE 40 MG/ML IJ SUSP
40.0000 mg | INTRAMUSCULAR | Status: AC | PRN
  Administered 2018-02-06: 40 mg via INTRA_ARTICULAR

## 2018-02-06 MED ORDER — DULOXETINE HCL 60 MG PO CPEP: 60.0000 mg | ORAL_CAPSULE | Freq: Every day | ORAL | 2 refills | Status: DC

## 2018-07-26 NOTE — Progress Notes (Deleted)
   Office Visit Note  Patient: Katie Branch             Date of Birth: Apr 15, 1971           MRN: [id number]             PCP: Monico Blitz, MD Referring: Monico Blitz, MD Visit Date: 08/09/2018 Occupation: @GUAROCC @  Subjective:  No chief complaint on file.   History of Present Illness: Katie Branch is a 47 y.o. female ***   Activities of Daily Living:  Patient reports morning stiffness for *** {minute/hour:19697}.   Patient {ACTIONS;DENIES/REPORTS:21021675::"Denies"} nocturnal pain.  Difficulty dressing/grooming: {ACTIONS;DENIES/REPORTS:21021675::"Denies"} Difficulty climbing stairs: {ACTIONS;DENIES/REPORTS:21021675::"Denies"} Difficulty getting out of chair: {ACTIONS;DENIES/REPORTS:21021675::"Denies"} Difficulty using hands for taps, buttons, cutlery, and/or writing: {ACTIONS;DENIES/REPORTS:21021675::"Denies"}  No Rheumatology ROS completed.   PMFS History:  Patient Active Problem List   Diagnosis Date Noted  . Essential hypertension 01/04/2017  . Gastroesophageal reflux disease without esophagitis 01/04/2017  . Fibromyalgia 01/03/2017  . Other fatigue 01/03/2017  . Primary insomnia 01/03/2017  . Plantar fasciitis 01/03/2017    Past Medical History:  Diagnosis Date  . Fatigue   . Fibromyalgia   . GERD (gastroesophageal reflux disease)   . Hypertension   . Plantar fasciitis     Family History  Problem Relation Age of Onset  . Cancer Mother        pancreatic   . Heart disease Father   . COPD Father    Past Surgical History:  Procedure Laterality Date  . FOOT SURGERY     Social History   Social History Narrative  . Not on file    Objective: Vital Signs: There were no vitals taken for this visit.   Physical Exam   Musculoskeletal Exam: ***  CDAI Exam: CDAI Score: Not documented Patient Global Assessment: Not documented; Provider Global Assessment: Not documented Swollen: Not documented; Tender: Not documented Joint Exam   Not documented    There is currently no information documented on the homunculus. Go to the Rheumatology activity and complete the homunculus joint exam.  Investigation: No additional findings.  Imaging: No results found.  Recent Labs: No results found for: WBC, HGB, PLT, NA, K, CL, CO2, GLUCOSE, BUN, CREATININE, BILITOT, ALKPHOS, AST, ALT, PROT, ALBUMIN, CALCIUM, GFRAA, QFTBGOLD, QFTBGOLDPLUS  Speciality Comments: No specialty comments available.  Procedures:  No procedures performed Allergies: Penicillins   Assessment / Plan:     Visit Diagnoses: No diagnosis found.   Orders: No orders of the defined types were placed in this encounter.  No orders of the defined types were placed in this encounter.   Face-to-face time spent with patient was *** minutes. Greater than 50% of time was spent in counseling and coordination of care.  Follow-Up Instructions: No follow-ups on file.   Earnestine Mealing, CMA  Note - This record has been created using Editor, commissioning.  Chart creation errors have been sought, but may not always  have been located. Such creation errors do not reflect on  the standard of medical care.

## 2018-08-07 NOTE — Progress Notes (Signed)
Office Visit Note  Patient: Katie Branch             Date of Birth: [birth year]           MRN: [id number]             PCP: Monico Blitz, MD Referring: Monico Blitz, MD Visit Date: 08/15/2018 Occupation: @GUAROCC @  Subjective:  Fatigue   History of Present Illness: Katie Branch is a 47 y.o. female with history of fibromyalgia.  She is on Cymbalta 60 mg by mouth daily and Baclofen 10 mg BID PRN for muscle spasms.  She has been having worsening fatigue recently. She sleeps about 6 hours per night and works 12-13 hour shifts.  She is no longer taking Ambien.  She has not needed to take OTC pain medications.  She contineus to have generalized muscle aches muscle tenderness.  She has bilateral trochanteric bursitis.  She continues to have bilateral SI joint pain.  She has muscle tension muscle tenderness in the trapezius muscles bilaterally.  She gets a massage once monthly.  She has discomfort in bilateral knee joints but denies any joint swelling.  She states that weight loss has helped.  She denies any plantar fasciitis at this time.    Activities of Daily Living:  Patient reports morning stiffness all day.   Patient Reports nocturnal pain.  Difficulty dressing/grooming: Denies Difficulty climbing stairs: Denies Difficulty getting out of chair: Denies Difficulty using hands for taps, buttons, cutlery, and/or writing: Denies  Review of Systems  Constitutional: Positive for fatigue.  HENT: Negative for mouth sores, trouble swallowing, trouble swallowing, mouth dryness and nose dryness.   Eyes: Negative for pain, redness, itching, visual disturbance and dryness.  Respiratory: Negative for cough, hemoptysis, shortness of breath, wheezing and difficulty breathing.   Cardiovascular: Negative for chest pain, palpitations, hypertension and swelling in legs/feet.  Gastrointestinal: Positive for constipation. Negative for abdominal pain, blood in stool, diarrhea, nausea and vomiting.  Endocrine:  Negative for increased urination.  Genitourinary: Negative for painful urination, nocturia and pelvic pain.  Musculoskeletal: Positive for arthralgias, joint pain and morning stiffness. Negative for joint swelling, myalgias, muscle weakness, muscle tenderness and myalgias.  Skin: Negative for color change, pallor, rash, hair loss, nodules/bumps, skin tightness, ulcers and sensitivity to sunlight.  Allergic/Immunologic: Negative for susceptible to infections.  Neurological: Negative for dizziness, light-headedness, numbness, headaches, memory loss and weakness.  Hematological: Negative for swollen glands.  Psychiatric/Behavioral: Negative for depressed mood, confusion and sleep disturbance. The patient is not nervous/anxious.     PMFS History:  Patient Active Problem List   Diagnosis Date Noted  . Essential hypertension 01/04/2017  . Gastroesophageal reflux disease without esophagitis 01/04/2017  . Fibromyalgia 01/03/2017  . Other fatigue 01/03/2017  . Primary insomnia 01/03/2017  . Plantar fasciitis 01/03/2017    Past Medical History:  Diagnosis Date  . Fatigue   . Fibromyalgia   . GERD (gastroesophageal reflux disease)   . Hypertension   . Plantar fasciitis     Family History  Problem Relation Age of Onset  . Cancer Mother        pancreatic   . Heart disease Father   . COPD Father    Past Surgical History:  Procedure Laterality Date  . FOOT SURGERY     Social History   Social History Narrative  . Not on file    Objective: Vital Signs: BP 125/80 (BP Location: Left Arm, Patient Position: Sitting, Cuff Size: Normal)   Pulse 67  Resp 13   Ht 5\' 5"  (1.651 m)   Wt 195 lb 12.8 oz (88.8 kg)   BMI 32.58 kg/m    Physical Exam  Constitutional: She is oriented to person, place, and time. She appears well-developed and well-nourished.  HENT:  Head: Normocephalic and atraumatic.  Eyes: Conjunctivae and EOM are normal.  Neck: Normal range of motion.  Cardiovascular:  Normal rate, regular rhythm, normal heart sounds and intact distal pulses.  Pulmonary/Chest: Effort normal and breath sounds normal.  Abdominal: Soft. Bowel sounds are normal.  Lymphadenopathy:    She has no cervical adenopathy.  Neurological: She is alert and oriented to person, place, and time.  Skin: Skin is warm and dry. Capillary refill takes less than 2 seconds.  Psychiatric: She has a normal mood and affect. Her behavior is normal.  Nursing note and vitals reviewed.    Musculoskeletal Exam: C-spine, thoracic spine, lumbar spine good range of motion.  No midline spinal tenderness.  No SI joint tenderness.  Shoulder joints, elbow joints, wrist joints, MCPs, PIPs, DIPs good range of motion with no synovitis.  Hip joints, knee joints, ankle joints, MTPs, PIPs, DIPs good range of motion no synovitis.  No warmth or effusion of bilateral knee joints.  Tenderness of bilateral trochanteric bursa.  Trapezius muscle tension and muscle tenderness.  CDAI Exam: CDAI Score: Not documented Patient Global Assessment: Not documented; Provider Global Assessment: Not documented Swollen: Not documented; Tender: Not documented Joint Exam   Not documented   There is currently no information documented on the homunculus. Go to the Rheumatology activity and complete the homunculus joint exam.  Investigation: No additional findings.  Imaging: No results found.  Recent Labs: No results found for: WBC, HGB, PLT, NA, K, CL, CO2, GLUCOSE, BUN, CREATININE, BILITOT, ALKPHOS, AST, ALT, PROT, ALBUMIN, CALCIUM, GFRAA, QFTBGOLD, QFTBGOLDPLUS  Speciality Comments: No specialty comments available.  Procedures:  No procedures performed Allergies: Penicillins   Assessment / Plan:     Visit Diagnoses: Fibromyalgia -She continues have generalized muscle aches muscle tenderness due to fibromyalgia.  She has muscle tension and muscle tension in the trapezius muscles bilaterally.  She has bilateral trochanteric  bursitis.  She has positive tender points on exam.  She continues take Cymbalta 60 mg by mouth daily.  A refill sent to the pharmacy today.  She is having worsening fatigue recently and continues to have interrupted sleep at night.  We discussed the importance of staying active and exercising on a regular basis.  She will follow-up in the office in 6 months.  Other fatigue: She has had worsening fatigue recently.  She sleeps about 6 hours per night.  We discussed the importance of staying active and exercising on a regular basis.  Primary insomnia: She continues to have interrupted sleep at night.  She sleeps about 6 hours per night.  She is no longer taking Ambien.  Good sleep hygiene was discussed.  Chronic SI joint pain: She has intermittent SI joint pain.  She has no tenderness on exam today.  Plantar fasciitis: Resolved.  Other medical conditions are listed as follows:  Gastroesophageal reflux disease without esophagitis  Essential hypertension   Orders: No orders of the defined types were placed in this encounter.  Meds ordered this encounter  Medications  . DULoxetine (CYMBALTA) 60 MG capsule    Sig: Take 1 capsule (60 mg total) by mouth daily.    Dispense:  30 capsule    Refill:  2      Follow-Up  Instructions: Return in about 6 months (around 02/14/2019) for Fibromyalgia.   Ofilia Neas, PA-C  Note - This record has been created using Dragon software.  Chart creation errors have been sought, but may not always  have been located. Such creation errors do not reflect on  the standard of medical care.

## 2018-08-09 ENCOUNTER — Ambulatory Visit: Payer: BLUE CROSS/BLUE SHIELD | Admitting: Physician Assistant

## 2018-08-15 ENCOUNTER — Encounter: Payer: Self-pay | Admitting: Physician Assistant

## 2018-08-15 ENCOUNTER — Ambulatory Visit: Payer: BLUE CROSS/BLUE SHIELD | Admitting: Physician Assistant

## 2018-08-15 VITALS — BP 125/80 | HR 67 | Resp 13 | Ht 65.0 in | Wt 195.8 lb

## 2018-08-15 DIAGNOSIS — M533 Sacrococcygeal disorders, not elsewhere classified: Secondary | ICD-10-CM | POA: Diagnosis not present

## 2018-08-15 DIAGNOSIS — R5383 Other fatigue: Secondary | ICD-10-CM

## 2018-08-15 DIAGNOSIS — M797 Fibromyalgia: Secondary | ICD-10-CM

## 2018-08-15 DIAGNOSIS — F5101 Primary insomnia: Secondary | ICD-10-CM

## 2018-08-15 DIAGNOSIS — M722 Plantar fascial fibromatosis: Secondary | ICD-10-CM

## 2018-08-15 DIAGNOSIS — G8929 Other chronic pain: Secondary | ICD-10-CM

## 2018-08-15 DIAGNOSIS — I10 Essential (primary) hypertension: Secondary | ICD-10-CM

## 2018-08-15 DIAGNOSIS — K219 Gastro-esophageal reflux disease without esophagitis: Secondary | ICD-10-CM

## 2018-08-15 MED ORDER — DULOXETINE HCL 60 MG PO CPEP: 60.0000 mg | ORAL_CAPSULE | Freq: Every day | ORAL | 2 refills | Status: DC

## 2018-10-10 NOTE — Progress Notes (Signed)
Office Visit Note  Patient: Katie Branch             Date of Birth: [birth year]           MRN: [id number]             PCP: Monico Blitz, MD Referring: Monico Blitz, MD Visit Date: 10/11/2018 Occupation: @GUAROCC @  Subjective:  Right knee pain and right elbow joint pain   History of Present Illness: CATHLYN TERSIGNI is a 47 y.o. female with history of fibromyalgia.  She is on Cymbalta 60 mg by mouth daily and Baclofen 10 mg BID for muscle spasms.  Patient presents today with right elbow pain for the past several weeks.  She denies any elbow joint swelling and has good range of motion.  She denies any injuries.  She states that at work she does perform a overuse activities.  She is also having bilateral knee pain.  She says the pain is most severe in her right knee.  She states that the pain became more severe starting in November.  She states that her right knee has been warm at times.  She denies any mechanical symptoms or injuries.  She has been taking Advil for pain relief.  She is been having a rapid sleep at night due to the pain she has been experiencing in her right knee joint.  She continues to have generalized muscle aches and muscle tenderness due to fibromyalgia.  She states her fatigue is also been worsening that she has not been sleeping well at night.   Activities of Daily Living:  Patient reports morning stiffness for 1 hour.   Patient Reports nocturnal pain.  Difficulty dressing/grooming: Denies Difficulty climbing stairs: Reports Difficulty getting out of chair: Denies Difficulty using hands for taps, buttons, cutlery, and/or writing: Denies  Review of Systems  Constitutional: Positive for fatigue.  HENT: Negative for ear discharge, mouth sores, trouble swallowing, trouble swallowing, mouth dryness and nose dryness.   Eyes: Negative for pain, redness, itching, visual disturbance and dryness.  Respiratory: Negative for cough, hemoptysis, shortness of breath, wheezing and  difficulty breathing.   Cardiovascular: Negative for chest pain, palpitations, hypertension and swelling in legs/feet.  Gastrointestinal: Negative for abdominal pain, blood in stool, constipation, diarrhea, nausea and vomiting.  Endocrine: Negative for increased urination.  Genitourinary: Negative for painful urination, nocturia and pelvic pain.  Musculoskeletal: Positive for arthralgias, joint pain and morning stiffness. Negative for joint swelling, myalgias, muscle weakness, muscle tenderness and myalgias.  Skin: Negative for color change, pallor, rash, hair loss, nodules/bumps, skin tightness, ulcers and sensitivity to sunlight.  Allergic/Immunologic: Negative for susceptible to infections.  Neurological: Negative for dizziness, light-headedness, numbness, headaches, memory loss and weakness.  Hematological: Negative for swollen glands.  Psychiatric/Behavioral: Negative for depressed mood, confusion and sleep disturbance. The patient is not nervous/anxious.     PMFS History:  Patient Active Problem List   Diagnosis Date Noted  . Essential hypertension 01/04/2017  . Gastroesophageal reflux disease without esophagitis 01/04/2017  . Fibromyalgia 01/03/2017  . Other fatigue 01/03/2017  . Primary insomnia 01/03/2017  . Plantar fasciitis 01/03/2017    Past Medical History:  Diagnosis Date  . Fatigue   . Fibromyalgia   . GERD (gastroesophageal reflux disease)   . Hypertension   . Plantar fasciitis     Family History  Problem Relation Age of Onset  . Cancer Mother        pancreatic   . Heart disease Father   . COPD  Father    Past Surgical History:  Procedure Laterality Date  . FOOT SURGERY     Social History   Social History Narrative  . Not on file    Objective: Vital Signs: BP 118/73 (BP Location: Left Arm, Patient Position: Sitting, Cuff Size: Large)   Pulse 75   Resp 13   Ht 5\' 5"  (1.651 m)   Wt 198 lb 12.8 oz (90.2 kg)   BMI 33.08 kg/m    Physical Exam Vitals  signs and nursing note reviewed.  Constitutional:      Appearance: She is well-developed.  HENT:     Head: Normocephalic and atraumatic.  Eyes:     Conjunctiva/sclera: Conjunctivae normal.  Neck:     Musculoskeletal: Normal range of motion.  Cardiovascular:     Rate and Rhythm: Normal rate and regular rhythm.     Heart sounds: Normal heart sounds.  Pulmonary:     Effort: Pulmonary effort is normal.     Breath sounds: Normal breath sounds.  Abdominal:     General: Bowel sounds are normal.     Palpations: Abdomen is soft.  Lymphadenopathy:     Cervical: No cervical adenopathy.  Skin:    General: Skin is warm and dry.     Capillary Refill: Capillary refill takes less than 2 seconds.  Neurological:     Mental Status: She is alert and oriented to person, place, and time.  Psychiatric:        Behavior: Behavior normal.      Musculoskeletal Exam: C-spine, thoracic spine, and lumbar spine good ROM.  No midline spinal tenderness.  No SI joint tenderness.  Shoulder joints, elbow joints, wrist joints, MCPs, PIPs, and DIPs good ROM with no synovitis.  Bilateral lateral epicondylitis. Hip joints, knee joints, ankle joints, MTPs, PIPs, and DIPs good ROM with no synovitis.  Right knee warmth on exam.    CDAI Exam: CDAI Score: Not documented Patient Global Assessment: Not documented; Provider Global Assessment: Not documented Swollen: Not documented; Tender: Not documented Joint Exam   Not documented   There is currently no information documented on the homunculus. Go to the Rheumatology activity and complete the homunculus joint exam.  Investigation: No additional findings.  Imaging: Xr Knee 3 View Right  Result Date: 10/11/2018 Moderate medial compartment joint space narrowing.  Mild patellofemoral joint space narrowing.  No chondrocalcinosis noted. 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  Procedures:  Large Joint Inj: R knee on 10/11/2018 10:22 AM Indications: pain Details: 27 G 1.5 in needle, medial approach  Arthrogram: No  Medications: 1.5 mL lidocaine 1 %; 40 mg triamcinolone acetonide 40 MG/ML Aspirate: 0 mL Outcome: tolerated well, no immediate complications Procedure, treatment alternatives, risks and benefits explained, specific risks discussed. Consent was given by the patient. Immediately prior to procedure a time out was called to verify the correct patient, procedure, equipment, support staff and site/side marked as required. Patient was prepped and draped in the usual sterile fashion.   Medium Joint Inj: R lateral epicondyle on 10/11/2018 10:22 AM Indications: pain Details: 27 G 1.5 in needle, lateral approach Medications: 1 mL lidocaine 1 %; 30 mg triamcinolone acetonide 40 MG/ML Aspirate: 0 mL Outcome: tolerated well, no immediate complications Procedure, treatment alternatives, risks and benefits explained, specific  risks discussed. Consent was given by the patient. Immediately prior to procedure a time out was called to verify the correct patient, procedure, equipment, support staff and site/side marked as required. Patient was prepped and draped in the usual sterile fashion.     Allergies: Penicillins   Assessment / Plan:     Visit Diagnoses: Fibromyalgia: She continues have generalized muscle aches and muscle tenderness due to fibromyalgia.  She has bilateral lateral epicondylitis.  She is most tender in the right lateral epicondyle and she requested a cortisone injection today.  She tolerated procedure well.  She continues to take Cymbalta 60 mg 1 tablet by mouth daily and baclofen 10 mg by mouth twice daily.  She is also on Topamax 25 mg twice daily.  She is  encouraged to stay active and exercise on a regular basis.  She is given exercises for lateral epicondylitis as well as knee exercises.  She will follow-up in the office in 6 months.  Primary insomnia: She has had interrupted sleep at night due to the pain she is been experiencing in her right knee joint.  Other fatigue: She has chronic fatigue that has been worsening recently due to insomnia.  She is been having very interrupted sleep at night due to the pain she is been experiencing right knee joint.  Chronic SI joint pain: She has mild bilateral SI joint tenderness on exam.  Plantar fasciitis: Resolved  Chronic pain of right knee -She has been having severe right knee pain for the past month and a half.  She has warmth of the right knee on exam.  She has good range of motion.  She has no mechanical symptoms at this time.  She has not had any recent injuries.  An x-ray of the right knee was obtained today.  Due to the active inflammation we will obtain the following labs.  She requested a right knee cortisone injection.  She tolerated the procedure well.  The procedure note is completed above.  She is advised to monitor blood pressure closely upon the cortisone injection.  She is also given a handout of knee exercises that she can perform at home.  She can continue taking Advil for pain relief and applying ice.  Plan: XR KNEE 3 VIEW RIGHT, 14-3-3 eta Protein, Cyclic citrul peptide antibody, IgG, Rheumatoid factor, Angiotensin converting enzyme, Uric acid, Sedimentation rate  Lateral epicondylitis of both elbows: She has tenderness bilaterally worse on the right.  She requested a right lateral epicondyle cortisone injection.  She tolerated the procedure well.  Procedure note is completed above.  She was given a handout of exercises that she can perform at home.  We also discussed that if she continues to have persistent symptoms physical therapy will be an option in the future.  We also discussed using a  lateral epicondylitis brace.  Essential hypertension: She was advised to monitor blood pressure closely following the cortisone injection today.  Gastroesophageal reflux disease without esophagitis    Orders: Orders Placed This Encounter  Procedures  . Large Joint Inj: R knee  . Medium Joint Inj: R lateral epicondyle  . XR KNEE 3 VIEW RIGHT  . 14-3-3 eta Protein  . Cyclic citrul peptide antibody, IgG  . Rheumatoid factor  . Angiotensin converting enzyme  . Uric acid  . Sedimentation rate   No orders of the defined types were placed in this encounter.   Face-to-face time spent with patient was 30 minutes. Greater than 50% of time was spent  in counseling and coordination of care.  Follow-Up Instructions: Return in about 6 months (around 04/12/2019) for Fibromyalgia.   Bo Merino, MD  Note - This record has been created using Editor, commissioning.  Chart creation errors have been sought, but may not always  have been located. Such creation errors do not reflect on  the standard of medical care.

## 2018-10-11 ENCOUNTER — Encounter: Payer: Self-pay | Admitting: Rheumatology

## 2018-10-11 ENCOUNTER — Ambulatory Visit: Payer: BLUE CROSS/BLUE SHIELD | Admitting: Rheumatology

## 2018-10-11 ENCOUNTER — Ambulatory Visit (INDEPENDENT_AMBULATORY_CARE_PROVIDER_SITE_OTHER): Payer: Self-pay

## 2018-10-11 VITALS — BP 118/73 | HR 75 | Resp 13 | Ht 65.0 in | Wt 198.8 lb

## 2018-10-11 DIAGNOSIS — M533 Sacrococcygeal disorders, not elsewhere classified: Secondary | ICD-10-CM

## 2018-10-11 DIAGNOSIS — G8929 Other chronic pain: Secondary | ICD-10-CM

## 2018-10-11 DIAGNOSIS — M7712 Lateral epicondylitis, left elbow: Secondary | ICD-10-CM

## 2018-10-11 DIAGNOSIS — M797 Fibromyalgia: Secondary | ICD-10-CM

## 2018-10-11 DIAGNOSIS — M7711 Lateral epicondylitis, right elbow: Secondary | ICD-10-CM | POA: Diagnosis not present

## 2018-10-11 DIAGNOSIS — M25561 Pain in right knee: Secondary | ICD-10-CM | POA: Diagnosis not present

## 2018-10-11 DIAGNOSIS — M722 Plantar fascial fibromatosis: Secondary | ICD-10-CM

## 2018-10-11 DIAGNOSIS — I10 Essential (primary) hypertension: Secondary | ICD-10-CM

## 2018-10-11 DIAGNOSIS — R5383 Other fatigue: Secondary | ICD-10-CM

## 2018-10-11 DIAGNOSIS — K219 Gastro-esophageal reflux disease without esophagitis: Secondary | ICD-10-CM

## 2018-10-11 DIAGNOSIS — F5101 Primary insomnia: Secondary | ICD-10-CM

## 2018-10-11 MED ORDER — TRIAMCINOLONE ACETONIDE 40 MG/ML IJ SUSP
30.0000 mg | INTRAMUSCULAR | Status: AC | PRN
  Administered 2018-10-11: 30 mg via INTRA_ARTICULAR

## 2018-10-11 MED ORDER — TRIAMCINOLONE ACETONIDE 40 MG/ML IJ SUSP
40.0000 mg | INTRAMUSCULAR | Status: AC | PRN
  Administered 2018-10-11: 40 mg via INTRA_ARTICULAR

## 2018-10-11 MED ORDER — LIDOCAINE HCL 1 % IJ SOLN
1.5000 mL | INTRAMUSCULAR | Status: AC | PRN
  Administered 2018-10-11: 1.5 mL

## 2018-10-11 MED ORDER — LIDOCAINE HCL 1 % IJ SOLN
1.0000 mL | INTRAMUSCULAR | Status: AC | PRN
  Administered 2018-10-11: 1 mL

## 2018-10-11 NOTE — Patient Instructions (Signed)
Tennis Elbow Rehab Ask your health care provider which exercises are safe for you. Do exercises exactly as told by your health care provider and adjust them as directed. It is normal to feel mild stretching, pulling, tightness, or discomfort as you do these exercises, but you should stop right away if you feel sudden pain or your pain gets worse. Do not begin these exercises until told by your health care provider. Stretching and range of motion exercises These exercises warm up your muscles and joints and improve the movement and flexibility of your elbow. These exercises also help to relieve pain, numbness, and tingling. Exercise A: Wrist extensor stretch 1. Extend your left / right elbow with your fingers pointing down. 2. Gently pull the palm of your left / right hand toward you until you feel a gentle stretch on the top of your forearm. 3. To increase the stretch, push your left / right hand toward the outer edge or pinkie side of your forearm. 4. Hold this position for __________ seconds. Repeat __________ times. Complete this exercise __________ times a day. If directed by your health care provider, repeat this stretch except do it with a bent elbow this time. Exercise B: Wrist flexor stretch  1. Extend your left / right elbow and turn your palm upward. 2. Gently pull your left / right palm and fingertips back so your wrist extends and your fingers point more toward the ground. 3. You should feel a gentle stretch on the inside of your forearm. 4. Hold this position for __________ seconds. Repeat __________ times. Complete this exercise __________ times a day. If directed by your health care provider, repeat this stretch except do it with a bent elbow this time. Strengthening exercises These exercises build strength and endurance in your elbow. Endurance is the ability to use your muscles for a long time, even after they get tired. Exercise C: Wrist extensors  1. Sit with your left /  right forearm palm-down and fully supported on a table or countertop. Your elbow should be resting below the height of your shoulder. 2. Let your left / right wrist extend over the edge of the surface. 3. Loosely hold a __________ weight or a piece of rubber exercise band or tubing in your left / right hand. Slowly curl your left / right hand up toward your forearm. If you are using band or tubing, hold the band or tubing in place with your other hand to provide resistance. 4. Hold this position for __________ seconds. 5. Slowly return to the starting position. Repeat __________ times. Complete this exercise __________ times a day. Exercise D: Radial deviators  1. Stand with a __________ weight in your left / righthand. Or, sit while holding a rubber exercise band or tubing with your other arm supported on a table or countertop. Position your hand so your thumb is on top. 2. Raise your hand upward in front of you so your thumb travels toward your forearm, or pull up on the rubber tubing. 3. Hold this position for __________ seconds. 4. Slowly return to the starting position. Repeat __________ times. Complete this exercise __________ times a day. Exercise E: Eccentric wrist extensors 1. Sit with your left / right forearm palm-down and fully supported on a table or countertop. Your elbow should be resting below the height of your shoulder. 2. If told by your health care provider, hold a __________ weight in your hand. 3. Let your left / right wrist extend over the edge of  the surface. 4. Use your other hand to lift up your left / right hand toward your forearm. Keep your forearm on the table. 5. Using only the muscles in your left / right hand, slowly lower your hand back down to the starting position. Repeat __________ times. Complete this exercise __________ times a day. This information is not intended to replace advice given to you by your health care provider. Make sure you discuss any  questions you have with your health care provider. Document Released: 10/10/2005 Document Revised: 06/15/2016 Document Reviewed: 07/09/2015 Elsevier Interactive Patient Education  2019 [place]. Knee Exercises              Ask your health care provider which exercises are safe for you. Do exercises exactly as told by your health care provider and adjust them as directed. It is normal to feel mild stretching, pulling, tightness, or discomfort as you do these exercises, but you should stop right away if you feel sudden pain or your pain gets worse.Do not begin these exercises until told by your health care provider. STRETCHING AND RANGE OF MOTION EXERCISES These exercises warm up your muscles and joints and improve the movement and flexibility of your knee. These exercises also help to relieve pain, numbness, and tingling. Exercise A: Knee Extension, Prone 1. Lie on your abdomen on a bed. 2. Place your left / right knee just beyond the edge of the surface so your knee is not on the bed. You can put a towel under your left / right thigh just above your knee for comfort. 3. Relax your leg muscles and allow gravity to straighten your knee. You should feel a stretch behind your left / right knee. 4. Hold this position for __________ seconds. 5. Scoot up so your knee is supported between repetitions. Repeat __________ times. Complete this stretch __________ times a day. Exercise B: Knee Flexion, Active 1. Lie on your back with both knees straight. If this causes back discomfort, bend your left / right knee so your foot is flat on the floor. 2. Slowly slide your left / right heel back toward your buttocks until you feel a gentle stretch in the front of your knee or thigh. 3. Hold this position for __________ seconds. 4. Slowly slide your left / right heel back to the starting position. Repeat __________ times. Complete this exercise __________ times a day. Exercise C: Quadriceps,  Prone 1. Lie on your abdomen on a firm surface, such as a bed or padded floor. 2. Bend your left / right knee and hold your ankle. If you cannot reach your ankle or pant leg, loop a belt around your foot and grab the belt instead. 3. Gently pull your heel toward your buttocks. Your knee should not slide out to the side. You should feel a stretch in the front of your thigh and knee. 4. Hold this position for __________ seconds. Repeat __________ times. Complete this stretch __________ times a day. Exercise D: Hamstring, Supine 1. Lie on your back. 2. Loop a belt or towel over the ball of your left / right foot. The ball of your foot is on the walking surface, right under your toes. 3. Straighten your left / right knee and slowly pull on the belt to raise your leg until you feel a gentle stretch behind your knee. ? Do not let your left / right knee bend while you do this. ? Keep your other leg flat on the floor. 4. Hold this position  for __________ seconds. Repeat __________ times. Complete this stretch __________ times a day. STRENGTHENING EXERCISES These exercises build strength and endurance in your knee. Endurance is the ability to use your muscles for a long time, even after they get tired. Exercise E: Quadriceps, Isometric 1. Lie on your back with your left / right leg extended and your other knee bent. Put a rolled towel or small pillow under your knee if told by your health care provider. 2. Slowly tense the muscles in the front of your left / right thigh. You should see your kneecap slide up toward your hip or see increased dimpling just above the knee. This motion will push the back of the knee toward the floor. 3. For __________ seconds, keep the muscle as tight as you can without increasing your pain. 4. Relax the muscles slowly and completely. Repeat __________ times. Complete this exercise __________ times a day. Exercise F: Straight Leg Raises - Quadriceps 1. Lie on your back with  your left / right leg extended and your other knee bent. 2. Tense the muscles in the front of your left / right thigh. You should see your kneecap slide up or see increased dimpling just above the knee. Your thigh may even shake a bit. 3. Keep these muscles tight as you raise your leg 4-6 inches (10-15 cm) off the floor. Do not let your knee bend. 4. Hold this position for __________ seconds. 5. Keep these muscles tense as you lower your leg. 6. Relax your muscles slowly and completely after each repetition. Repeat __________ times. Complete this exercise __________ times a day. Exercise G: Hamstring, Isometric 1. Lie on your back on a firm surface. 2. Bend your left / right knee approximately __________ degrees. 3. Dig your left / right heel into the surface as if you are trying to pull it toward your buttocks. Tighten the muscles in the back of your thighs to dig as hard as you can without increasing any pain. 4. Hold this position for __________ seconds. 5. Release the tension gradually and allow your muscles to relax completely for __________ seconds after each repetition. Repeat __________ times. Complete this exercise __________ times a day. Exercise H: Hamstring Curls If told by your health care provider, do this exercise while wearing ankle weights. Begin with __________ weights. Then increase the weight by 1 lb (0.5 kg) increments. Do not wear ankle weights that are more than __________. 1. Lie on your abdomen with your legs straight. 2. Bend your left / right knee as far as you can without feeling pain. Keep your hips flat against the floor. 3. Hold this position for __________ seconds. 4. Slowly lower your leg to the starting position. Repeat __________ times. Complete this exercise __________ times a day. Exercise I: Squats (Quadriceps) 1. Stand in front of a table, with your feet and knees pointing straight ahead. You may rest your hands on the table for balance but not for  support. 2. Slowly bend your knees and lower your hips like you are going to sit in a chair. ? Keep your weight over your heels, not over your toes. ? Keep your lower legs upright so they are parallel with the table legs. ? Do not let your hips go lower than your knees. ? Do not bend lower than told by your health care provider. ? If your knee pain increases, do not bend as low. 3. Hold the squat position for __________ seconds. 4. Slowly push with your legs to  return to standing. Do not use your hands to pull yourself to standing. Repeat __________ times. Complete this exercise __________ times a day. Exercise J: Wall Slides (Quadriceps) 1. Lean your back against a smooth wall or door while you walk your feet out 18-24 inches (46-61 cm) from it. 2. Place your feet hip-width apart. 3. Slowly slide down the wall or door until your knees bend __________ degrees. Keep your knees over your heels, not over your toes. Keep your knees in line with your hips. 4. Hold for __________ seconds. Repeat __________ times. Complete this exercise __________ times a day. Exercise K: Straight Leg Raises - Hip Abductors 1. Lie on your side with your left / right leg in the top position. Lie so your head, shoulder, knee, and hip line up. You may bend your bottom knee to help you keep your balance. 2. Roll your hips slightly forward so your hips are stacked directly over each other and your left / right knee is facing forward. 3. Leading with your heel, lift your top leg 4-6 inches (10-15 cm). You should feel the muscles in your outer hip lifting. ? Do not let your foot drift forward. ? Do not let your knee roll toward the ceiling. 4. Hold this position for __________ seconds. 5. Slowly return your leg to the starting position. 6. Let your muscles relax completely after each repetition. Repeat __________ times. Complete this exercise __________ times a day. Exercise L: Straight Leg Raises - Hip Extensors 1. Lie  on your abdomen on a firm surface. You can put a pillow under your hips if that is more comfortable. 2. Tense the muscles in your buttocks and lift your left / right leg about 4-6 inches (10-15 cm). Keep your knee straight as you lift your leg. 3. Hold this position for __________ seconds. 4. Slowly lower your leg to the starting position. 5. Let your leg relax completely after each repetition. Repeat __________ times. Complete this exercise __________ times a day. This information is not intended to replace advice given to you by your health care provider. Make sure you discuss any questions you have with your health care provider. Document Released: 08/24/2005 Document Revised: 07/04/2016 Document Reviewed: 08/16/2015 Elsevier Interactive Patient Education  2019 [employer].

## 2018-10-12 MED ORDER — BACLOFEN 10 MG PO TABS: 10.0000 mg | ORAL_TABLET | Freq: Two times a day (BID) | ORAL | 0 refills | Status: DC | PRN

## 2018-10-12 NOTE — Progress Notes (Signed)
CCP weak positive.  Please advise patient to make a sooner appointment. Not urgent appointment.

## 2018-10-12 NOTE — Progress Notes (Signed)
RF negative. Uric acid 3.3-within desirable range.  Sed rate WNL

## 2018-10-12 NOTE — Telephone Encounter (Signed)
Ok to refill 30-day supply

## 2018-10-12 NOTE — Telephone Encounter (Signed)
Last visit: 10/11/2018 Next visit: 04/11/2019  Okay to refill baclofen?

## 2018-10-15 LAB — URIC ACID: Uric Acid, Serum: 3.3 mg/dL (ref 2.5–7.0)

## 2018-10-15 LAB — 14-3-3 ETA PROTEIN

## 2018-10-15 LAB — RHEUMATOID FACTOR: Rheumatoid fact SerPl-aCnc: <14 IU/mL (ref <14)

## 2018-10-15 LAB — SEDIMENTATION RATE: Sed Rate: 19 mm/h (ref 0–20)

## 2018-10-15 LAB — CYCLIC CITRUL PEPTIDE ANTIBODY, IGG: CYCLIC CITRULLIN PEPTIDE AB: 31 U — AB

## 2018-10-15 LAB — ANGIOTENSIN CONVERTING ENZYME: Angiotensin-Converting Enzyme: 10 U/L (ref 9–67)

## 2018-10-15 NOTE — Progress Notes (Signed)
Office Visit Note  Patient: Katie Branch             Date of Birth: October 16, 1971           MRN: [id number]             PCP: Monico Blitz, MD Referring: Monico Blitz, MD Visit Date: 10/29/2018 Occupation: @GUAROCC @  Subjective:  Right elbow and right knee pain.Marland Kitchen   History of Present Illness: Katie Branch is a 47 y.o. female with history of osteoarthritis.  She states after the last visit when she returned back to work her right tennis elbow symptoms recurred.  She was also having discomfort in her right knee joint.  She states she had sudden popping in her right knee joint and after that the symptoms in her knee joint improved.  He is not having any flare of plantar fasciitis now.  She continues to have discomfort in the SI joints.  Activities of Daily Living: Patient reports morning stiffness for 30 minutes.   Patient Reports nocturnal pain.  Difficulty dressing/grooming: Denies Difficulty climbing stairs: Denies Difficulty getting out of chair: Denies Difficulty using hands for taps, buttons, cutlery, and/or writing: Denies  Review of Systems  Constitutional: Positive for fatigue. Negative for night sweats, weight gain and weight loss.  HENT: Negative for mouth sores, trouble swallowing, trouble swallowing, mouth dryness and nose dryness.   Eyes: Negative for pain, redness, visual disturbance and dryness.  Respiratory: Negative for cough, shortness of breath and difficulty breathing.   Cardiovascular: Negative for chest pain, palpitations, hypertension, irregular heartbeat and swelling in legs/feet.  Gastrointestinal: Negative for blood in stool, constipation and diarrhea.  Endocrine: Negative for increased urination.  Genitourinary: Negative for vaginal dryness.  Musculoskeletal: Positive for arthralgias, joint pain, myalgias and myalgias. Negative for joint swelling, muscle weakness, morning stiffness and muscle tenderness.  Skin: Negative for color change, rash, hair loss,  skin tightness, ulcers and sensitivity to sunlight.  Allergic/Immunologic: Negative for susceptible to infections.  Neurological: Negative for dizziness, memory loss, night sweats and weakness.  Hematological: Negative for swollen glands.  Psychiatric/Behavioral: Positive for sleep disturbance. Negative for depressed mood. The patient is not nervous/anxious.     PMFS History:  Patient Active Problem List   Diagnosis Date Noted  . Essential hypertension 01/04/2017  . Gastroesophageal reflux disease without esophagitis 01/04/2017  . Fibromyalgia 01/03/2017  . Other fatigue 01/03/2017  . Primary insomnia 01/03/2017  . Plantar fasciitis 01/03/2017    Past Medical History:  Diagnosis Date  . Fatigue   . Fibromyalgia   . GERD (gastroesophageal reflux disease)   . Hypertension   . Plantar fasciitis     Family History  Problem Relation Age of Onset  . Cancer Mother        pancreatic   . Heart disease Father   . COPD Father    Past Surgical History:  Procedure Laterality Date  . FOOT SURGERY     Social History   Social History Narrative  . Not on file    Objective: Vital Signs: BP 123/78 (BP Location: Left Wrist, Patient Position: Sitting, Cuff Size: Normal)   Pulse 82   Resp 14   Ht 5\' 5"  (1.651 m)   Wt 195 lb 3.2 oz (88.5 kg)   BMI 32.48 kg/m    Physical Exam Vitals signs and nursing note reviewed.  Constitutional:      Appearance: She is well-developed.  HENT:     Head: Normocephalic and atraumatic.  Eyes:  Conjunctiva/sclera: Conjunctivae normal.  Neck:     Musculoskeletal: Normal range of motion.  Cardiovascular:     Rate and Rhythm: Normal rate and regular rhythm.     Heart sounds: Normal heart sounds.  Pulmonary:     Effort: Pulmonary effort is normal.     Breath sounds: Normal breath sounds.  Abdominal:     General: Bowel sounds are normal.     Palpations: Abdomen is soft.  Lymphadenopathy:     Cervical: No cervical adenopathy.  Skin:     General: Skin is warm and dry.     Capillary Refill: Capillary refill takes less than 2 seconds.  Neurological:     Mental Status: She is alert and oriented to person, place, and time.  Psychiatric:        Behavior: Behavior normal.      Musculoskeletal Exam: C-spine thoracic lumbar spine good range of motion.  Shoulder joints elbow joints wrist joint MCPs PIPs DIPs with good range of motion with no synovitis.  Hip joints knee joints ankles MTPs PIPs been good range of motion with no synovitis.  She has some tenderness on palpation over right lateral epicondyle area.  She also had tenderness over bilateral trochanteric bursa.   CDAI Exam: CDAI Score: Not documented Patient Global Assessment: Not documented; Provider Global Assessment: Not documented Swollen: Not documented; Tender: Not documented Joint Exam   Not documented   There is currently no information documented on the homunculus. Go to the Rheumatology activity and complete the homunculus joint exam.  Investigation: No additional findings. October 11, 2018 RF negative, anti-CCP 31-week positive, 14 3 3  eta negative, ACE negative, uric acid normal, sed rate 19 Imaging: Xr Knee 3 View Right  Result Date: 10/11/2018 Moderate medial compartment joint space narrowing.  Mild patellofemoral joint space narrowing.  No chondrocalcinosis noted. 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  Procedures:  No procedures performed Allergies: Penicillins   Assessment / Plan:     Visit Diagnoses: Chronic SI joint pain-patient had no SI joint tenderness on examination.  She continues to have some lower back pain which I believe is secondary to fibromyalgia and  muscle spasm.  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  I believe the recurrent epicondylitis is related to her work.  She has been using a tennis elbow brace which is been helpful.  Trochanteric bursitis of both hips-she had tenderness on palpation over bilateral trochanteric bursa.  She also has some nocturnal pain.  IT band exercises were discussed.  Positive anti-CCP test - Week positive anti-CCP at 31.  Patient had no synovitis on examination today.  Significance of anti-CCP antibody was discussed.  She is a non-smoker.  Good dental hygiene was discussed.  Primary osteoarthritis of right knee -she had good response to cortisone injection.  She is currently not having any symptoms in her right knee.  Fibromyalgia-she continues to have some generalized pain and discomfort from fibromyalgia.  Primary insomnia-good sleep hygiene was discussed.  Other fatigue  Essential hypertension  Gastroesophageal reflux disease without esophagitis   Orders: No orders of the defined types were placed in this encounter.  No orders of the defined types were placed in this encounter.  Follow-Up Instructions: Return in about 6 months (around 04/29/2019) for Osteoarthritis.   Bo Merino, MD  Note - This record has been created using Editor, commissioning.  Chart creation errors have been sought, but may not always  have been located. Such creation errors do not reflect on  the standard of medical care.

## 2018-10-18 NOTE — Progress Notes (Signed)
14-3-3 eta negative. ACE WNL.

## 2018-10-29 ENCOUNTER — Encounter: Payer: Self-pay | Admitting: Rheumatology

## 2018-10-29 ENCOUNTER — Ambulatory Visit: Payer: BLUE CROSS/BLUE SHIELD | Admitting: Rheumatology

## 2018-10-29 VITALS — BP 123/78 | HR 82 | Resp 14 | Ht 65.0 in | Wt 195.2 lb

## 2018-10-29 DIAGNOSIS — M7712 Lateral epicondylitis, left elbow: Secondary | ICD-10-CM

## 2018-10-29 DIAGNOSIS — R768 Other specified abnormal immunological findings in serum: Secondary | ICD-10-CM

## 2018-10-29 DIAGNOSIS — I10 Essential (primary) hypertension: Secondary | ICD-10-CM

## 2018-10-29 DIAGNOSIS — R5383 Other fatigue: Secondary | ICD-10-CM

## 2018-10-29 DIAGNOSIS — M533 Sacrococcygeal disorders, not elsewhere classified: Secondary | ICD-10-CM

## 2018-10-29 DIAGNOSIS — M7062 Trochanteric bursitis, left hip: Secondary | ICD-10-CM

## 2018-10-29 DIAGNOSIS — M7061 Trochanteric bursitis, right hip: Secondary | ICD-10-CM | POA: Diagnosis not present

## 2018-10-29 DIAGNOSIS — G8929 Other chronic pain: Secondary | ICD-10-CM

## 2018-10-29 DIAGNOSIS — M7711 Lateral epicondylitis, right elbow: Secondary | ICD-10-CM

## 2018-10-29 DIAGNOSIS — M1711 Unilateral primary osteoarthritis, right knee: Secondary | ICD-10-CM

## 2018-10-29 DIAGNOSIS — K219 Gastro-esophageal reflux disease without esophagitis: Secondary | ICD-10-CM

## 2018-10-29 DIAGNOSIS — F5101 Primary insomnia: Secondary | ICD-10-CM

## 2018-10-29 DIAGNOSIS — M797 Fibromyalgia: Secondary | ICD-10-CM

## 2019-02-14 ENCOUNTER — Ambulatory Visit: Payer: BLUE CROSS/BLUE SHIELD | Admitting: Physician Assistant

## 2019-03-28 NOTE — Progress Notes (Signed)
Office Visit Note  Patient: Katie Branch             Date of Birth: [birth year]           MRN: [id number]             PCP: Monico Blitz, MD Referring: Monico Blitz, MD Visit Date: 04/11/2019 Occupation: @GUAROCC @  Subjective:  Right knee pain improving   History of Present Illness: MIDGE MOMON is a 48 y.o. female with history of osteoarthritis and fibromyalgia.  Patient states that she has not had any recent fibromyalgia flares.  She states that she does have some mild muscle tenderness or tender points.  She states her fatigue has been manageable recently.  She has been sleeping good at night.  She reports that over the past 2 months she has been having increased right knee joint pain.  She denies any joint swelling.  She states that for the past 3 days her right knee joint pain has improved.  She denies any recent injuries or falls.  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  She did cortisone injection the right knee on 10/11/2018 and provided temporary relief.  She does not want another injection today.  She denies any other joint pain or joint swelling at this time.  She reports that her right elbow lateral epicondylitis has resolved since having a cortisone injection on 10/11/2018.   Activities of Daily Living:  Patient reports morning stiffness for 1 hour.   Patient Reports nocturnal pain.  Difficulty dressing/grooming: Denies Difficulty climbing stairs: Denies Difficulty getting out of chair: Reports Difficulty using hands for taps, buttons, cutlery, and/or writing: Denies  Review of Systems  Constitutional: Positive for fatigue.  HENT: Negative for mouth dryness.   Eyes: Negative for pain and dryness.  Respiratory: Negative for shortness of breath, wheezing and difficulty breathing.   Cardiovascular: Negative for swelling in legs/feet.  Gastrointestinal: Negative for constipation and diarrhea.  Endocrine: Negative for excessive thirst.  Genitourinary: Negative  for difficulty urinating.  Musculoskeletal: Positive for arthralgias, joint pain and morning stiffness. Negative for joint swelling, muscle weakness and muscle tenderness.  Skin: Negative for rash and redness.  Allergic/Immunologic: Negative for susceptible to infections.  Neurological: Positive for headaches. Negative for dizziness.  Hematological: Positive for bruising/bleeding tendency.  Psychiatric/Behavioral: Negative for sleep disturbance.    PMFS History:  Patient Active Problem List   Diagnosis Date Noted  . Essential hypertension 01/04/2017  . Gastroesophageal reflux disease without esophagitis 01/04/2017  . Fibromyalgia 01/03/2017  . Other fatigue 01/03/2017  . Primary insomnia 01/03/2017  . Plantar fasciitis 01/03/2017    Past Medical History:  Diagnosis Date  . Fatigue   . Fibromyalgia   . GERD (gastroesophageal reflux disease)   . Hypertension   . Plantar fasciitis     Family History  Problem Relation Age of Onset  . Cancer Mother        pancreatic   . Heart disease Father   . COPD Father    Past Surgical History:  Procedure Laterality Date  . FOOT SURGERY     Social History   Social History Narrative  . Not on file    There is no immunization history on file for this patient.   Objective: Vital Signs: BP 106/68 (BP Location: Right Arm, Patient Position: Sitting, Cuff Size: Large)   Pulse 79   Resp 12   Ht 5\' 5"  (1.651 m)   Wt 202 lb (91.6 kg)  BMI 33.61 kg/m    Physical Exam Vitals signs and nursing note reviewed.  Constitutional:      Appearance: She is well-developed.  HENT:     Head: Normocephalic and atraumatic.  Eyes:     Conjunctiva/sclera: Conjunctivae normal.  Neck:     Musculoskeletal: Normal range of motion.  Cardiovascular:     Rate and Rhythm: Normal rate and regular rhythm.     Heart sounds: Normal heart sounds.  Pulmonary:     Effort: Pulmonary effort is normal.     Breath sounds: Normal breath sounds.  Abdominal:      General: Bowel sounds are normal.     Palpations: Abdomen is soft.  Lymphadenopathy:     Cervical: No cervical adenopathy.  Skin:    General: Skin is warm and dry.     Capillary Refill: Capillary refill takes less than 2 seconds.  Neurological:     Mental Status: She is alert and oriented to person, place, and time.  Psychiatric:        Behavior: Behavior normal.      Musculoskeletal Exam: C-spine, thoracic spine, lumbar spine good range of motion.  No midline spinal tenderness.  No SI joint tenderness.  Shoulder joints, elbows, wrist joints, MCPs, PIPs, DIPs good range of motion no synovitis.  She has complete fist formation bilaterally.  Hip joints, knee joints, ankle joints, MTPs, PIPs, DIPs good range of motion no synovitis.  Mild warmth of the right knee joint on exam.  No tenderness or swelling of ankle joints.  No Achilles tendinitis or plantar fasciitis.  She has tenderness over bilateral trochanteric bursa.  CDAI Exam: CDAI Score: - Patient Global: -; Provider Global: - Swollen: -; Tender: - Joint Exam   No joint exam has been documented for this visit   There is currently no information documented on the homunculus. Go to the Rheumatology activity and complete the homunculus joint exam.  Investigation: No additional findings.  Imaging: No results found.  Recent Labs: No results found for: WBC, HGB, PLT, NA, K, CL, CO2, GLUCOSE, BUN, CREATININE, BILITOT, ALKPHOS, AST, ALT, PROT, ALBUMIN, CALCIUM, GFRAA, QFTBGOLD, QFTBGOLDPLUS  Speciality Comments: No specialty comments available.  Procedures:  No procedures performed Allergies: Penicillins   Assessment / Plan:     Visit Diagnoses: Chronic SI joint pain - She has no SI joint tenderness at this time.   Positive anti-CCP test - Weak positive anti-CCP at 31: She has no synovitis or tenderness on exam.  She was encouraged to notify us if develops increased joint pain or joint swelling.  Primary osteoarthritis of  right knee - She has mild warmth of the right knee joint. No joint effusion noted. She has intermittent right knee joint pain.  Her pain has been improving for the past 3 days.  She has not had any recent injuries or falls.  She had a x-ray of the right knee joint performed on 10/11/2018 that revealed moderate osteoarthritis and mild chondromalacia patella. She had a cortisone injection on 10/11/18 which provided temporary relief.  She declined a cortisone injection today.  She was advised to notify us if she develops increased joint pain or joint swelling. She was encouraged to ice, elevate, and use voltaren gel.  She can use a compression sleeve for support while at work.   Fibromyalgia -She has not had any recent fibromyalgia flares.  She has mild muscle tenderness and tender points. Her fatigue has been manageable and she has been sleeping well at night.  She takes Cymbalta 60 mg 1 capsule by mouth daily and baclofen 10 mg twice daily PRN for muscle spasms.  She does not need any refills at this time.  She was encouraged to stay active and exercise on a regular basis.   Lateral epicondylitis of both elbows -Resolved.  She has no tenderness or inflammation at this time.  She had a right lateral epicondyle injection on 10/11/2018 which resolved her symptoms.  Trochanteric bursitis of both hips -She has tenderness over bilateral trochanteric bursa.  She was encouraged to perform stretching exercises on a regular basis and change positions frequently.  Other fatigue -She has chronic fatigue that is been stable.  Her fatigue has been manageable recently.  Primary insomnia - She has been sleeping well at night.   Other medical conditions are listed as follows:   Gastroesophageal reflux disease without esophagitis   Essential hypertension   Orders: No orders of the defined types were placed in this encounter.  No orders of the defined types were placed in this encounter.     Follow-Up  Instructions: Return in about 6 months (around 10/11/2019) for Fibromyalgia, Osteoarthritis.   Ofilia Neas, PA-C I examined and evaluated the patient with Hazel Sams PA.  Patient continues to have some pain and discomfort from fibromyalgia and osteoarthritis.  She had no synovitis on examination today.  The plan of care was discussed as noted above.  Bo Merino, MD Note - This record has been created using Editor, commissioning.  Chart creation errors have been sought, but may not always  have been located. Such creation errors do not reflect on  the standard of medical care.

## 2019-04-11 ENCOUNTER — Encounter: Payer: Self-pay | Admitting: Rheumatology

## 2019-04-11 ENCOUNTER — Other Ambulatory Visit: Payer: Self-pay

## 2019-04-11 ENCOUNTER — Ambulatory Visit: Payer: BC Managed Care – PPO | Admitting: Rheumatology

## 2019-04-11 VITALS — BP 106/68 | HR 79 | Resp 12 | Ht 65.0 in | Wt 202.0 lb

## 2019-04-11 DIAGNOSIS — M7711 Lateral epicondylitis, right elbow: Secondary | ICD-10-CM

## 2019-04-11 DIAGNOSIS — M533 Sacrococcygeal disorders, not elsewhere classified: Secondary | ICD-10-CM

## 2019-04-11 DIAGNOSIS — R5383 Other fatigue: Secondary | ICD-10-CM

## 2019-04-11 DIAGNOSIS — R768 Other specified abnormal immunological findings in serum: Secondary | ICD-10-CM | POA: Diagnosis not present

## 2019-04-11 DIAGNOSIS — I10 Essential (primary) hypertension: Secondary | ICD-10-CM

## 2019-04-11 DIAGNOSIS — M797 Fibromyalgia: Secondary | ICD-10-CM | POA: Diagnosis not present

## 2019-04-11 DIAGNOSIS — K219 Gastro-esophageal reflux disease without esophagitis: Secondary | ICD-10-CM

## 2019-04-11 DIAGNOSIS — M7712 Lateral epicondylitis, left elbow: Secondary | ICD-10-CM

## 2019-04-11 DIAGNOSIS — M7061 Trochanteric bursitis, right hip: Secondary | ICD-10-CM

## 2019-04-11 DIAGNOSIS — M1711 Unilateral primary osteoarthritis, right knee: Secondary | ICD-10-CM | POA: Diagnosis not present

## 2019-04-11 DIAGNOSIS — G8929 Other chronic pain: Secondary | ICD-10-CM

## 2019-04-11 DIAGNOSIS — F5101 Primary insomnia: Secondary | ICD-10-CM

## 2019-04-11 DIAGNOSIS — M7062 Trochanteric bursitis, left hip: Secondary | ICD-10-CM

## 2019-09-17 ENCOUNTER — Ambulatory Visit: Payer: BC Managed Care – PPO | Admitting: Sports Medicine

## 2019-09-17 ENCOUNTER — Other Ambulatory Visit: Payer: Self-pay | Admitting: Sports Medicine

## 2019-09-17 ENCOUNTER — Ambulatory Visit (INDEPENDENT_AMBULATORY_CARE_PROVIDER_SITE_OTHER): Payer: BC Managed Care – PPO

## 2019-09-17 ENCOUNTER — Telehealth: Payer: Self-pay | Admitting: *Deleted

## 2019-09-17 ENCOUNTER — Other Ambulatory Visit: Payer: Self-pay

## 2019-09-17 ENCOUNTER — Encounter: Payer: Self-pay | Admitting: Sports Medicine

## 2019-09-17 VITALS — BP 116/55 | HR 69 | Resp 16

## 2019-09-17 DIAGNOSIS — M722 Plantar fascial fibromatosis: Secondary | ICD-10-CM

## 2019-09-17 DIAGNOSIS — M7989 Other specified soft tissue disorders: Secondary | ICD-10-CM | POA: Diagnosis not present

## 2019-09-17 DIAGNOSIS — M79673 Pain in unspecified foot: Secondary | ICD-10-CM

## 2019-09-17 DIAGNOSIS — M79672 Pain in left foot: Secondary | ICD-10-CM

## 2019-09-17 NOTE — Telephone Encounter (Signed)
Orders to Dr. Leeanne Rio assistant for pre-cert, faxed to [facility].

## 2019-09-17 NOTE — Telephone Encounter (Signed)
-----   Message from Landis Martins, [place] sent at 09/17/2019  2:07 PM EST ----- Regarding: MRI L foot Arch pain soft tissue mass

## 2019-09-17 NOTE — Progress Notes (Addendum)
Subjective: Katie Branch is a 48 y.o. female patient presents to office with complaint of arch pain L>R. Reports that knot popped up at arch about 2 months ago was small now getting large and painful. History of previous EPF and Shockwave 20 years ago. Denies any other pedal complaints.   Review of Systems  All other systems reviewed and are negative.    Patient Active Problem List   Diagnosis Date Noted  . Essential hypertension 01/04/2017  . Gastroesophageal reflux disease without esophagitis 01/04/2017  . Fibromyalgia 01/03/2017  . Other fatigue 01/03/2017  . Primary insomnia 01/03/2017  . Plantar fasciitis 01/03/2017    Current Outpatient Medications on File Prior to Visit  Medication Sig Dispense Refill  . baclofen (LIORESAL) 10 MG tablet Take 1 tablet (10 mg total) by mouth 2 (two) times daily as needed for muscle spasms. 60 each 0  . DULoxetine (CYMBALTA) 60 MG capsule Take 1 capsule (60 mg total) by mouth daily. 30 capsule 2  . lisinopril (PRINIVIL,ZESTRIL) 20 MG tablet Take 20 mg by mouth daily.   1  . rosuvastatin (CRESTOR) 20 MG tablet Take 20 mg by mouth daily.    Marland Kitchen topiramate (TOPAMAX) 25 MG tablet Take 25 mg by mouth 2 (two) times daily.     No current facility-administered medications on file prior to visit.     Allergies  Allergen Reactions  . Penicillins     Objective: Physical Exam General: The patient is alert and oriented x3 in no acute distress.  Dermatology: Skin is warm, dry and supple bilateral lower extremities. Nails 1-10 are normal. There is no erythema, edema, no eccymosis, no open lesions present. Raised soft tissue mass in arch on left, old scar at right arch, Integument is otherwise unremarkable.  Vascular: Dorsalis Pedis pulse and Posterior Tibial pulse are 2/4 bilateral. Capillary fill time is immediate to all digits.  Neurological: Grossly intact to light touch with an achilles reflex of +2/5 and a negative Tinel's sign  bilateral.  Musculoskeletal: Tenderness to palpation at the soft tissue mass at arch on left. There is decreased Ankle joint range of motion bilateral. All other joints range of motion within normal limits bilateral. Strength 5/5 in all groups bilateral.   Gait: Unassisted, Antalgic avoid weight on arch on left  Xray, Right/Left foot:  Normal osseous mineralization. Joint spaces preserved. No fracture/dislocation/boney destruction. Calcaneal spur present with mild thickening of plantar fascia. No other soft tissue abnormalities or radiopaque foreign bodies.   Assessment and Plan: Problem List Items Addressed This Visit      Musculoskeletal and Integument   Plantar fasciitis - Primary    Other Visit Diagnoses    Plantar fascial fibromatosis of left foot       Arch pain, unspecified laterality       Soft tissue mass           -Complete examination performed.  -Xrays reviewed -Discussed with patient in detail the condition of plantar fibroma/soft tissue mass on left and scar on right, how this occurs and general treatment options. Explained both conservative and surgical treatments.  -Rx MRI for left for eval of fibroma -Rx verapimil topically to use to both areas  -Dispensed offloading padding  -Patient to return to office after MRI for follow up or sooner if problems or questions arise.  Landis Martins, DPM

## 2019-09-17 NOTE — Patient Instructions (Signed)

## 2019-09-25 ENCOUNTER — Telehealth: Payer: Self-pay

## 2019-10-01 NOTE — Telephone Encounter (Signed)
I tried to do an appeal and have called twice and waited over 2 hours on the line. Unable to do Peer to Peer.  -Dr. Cannon Kettle

## 2019-10-10 ENCOUNTER — Telehealth: Payer: Self-pay

## 2019-10-10 ENCOUNTER — Ambulatory Visit: Payer: BC Managed Care – PPO | Admitting: Rheumatology

## 2019-10-10 NOTE — Telephone Encounter (Signed)
Patient called back with a different phone number to try. I was able to speak with Levada Dy and start the case over. After only 20 minutes on the phone, the MRI was approved with order # [id number]. The patient has been scheduled at [place] on 10/15/2019.

## 2019-10-10 NOTE — Telephone Encounter (Signed)
Great thank you!

## 2019-10-10 NOTE — Telephone Encounter (Signed)
I called and spoke with Ms. Deisher and explained that we have spent more than four hours (combined) on the phone with her insurance company and still have been unable to acquire approval for the  MRI of her left foot. She will call her insurance company and see if she can find out what we can do to get this resolved. She will call us back and let us know.

## 2019-10-15 ENCOUNTER — Ambulatory Visit
Admission: RE | Admit: 2019-10-15 | Discharge: 2019-10-15 | Disposition: A | Discharge status: Home / Self Care | Payer: BC Managed Care – PPO | Source: Ambulatory Visit | Attending: Sports Medicine | Admitting: Sports Medicine

## 2019-10-15 DIAGNOSIS — M79673 Pain in unspecified foot: Secondary | ICD-10-CM

## 2019-10-15 DIAGNOSIS — M722 Plantar fascial fibromatosis: Secondary | ICD-10-CM

## 2019-10-15 MED ORDER — GADOBENATE DIMEGLUMINE 529 MG/ML IV SOLN
20.0000 mL | Freq: Once | INTRAVENOUS | Status: AC | PRN
  Administered 2019-10-15: 20 mL via INTRAVENOUS

## 2019-11-15 ENCOUNTER — Telehealth: Payer: Self-pay | Admitting: Sports Medicine

## 2019-11-15 NOTE — Telephone Encounter (Signed)
Patient should make an appointment to come to office to discuss results and plan of care. FYI if patient asks she does have a fibroma and a partial tear of the peroneal tendon at the lateral side of her foot. Thanks Dr. Cannon Kettle

## 2019-11-15 NOTE — Telephone Encounter (Signed)
Pt called to say she was wanting results from MRI done on 10/15/2019 for Dr.Stover

## 2019-11-18 NOTE — Telephone Encounter (Signed)
I spoke with pt and informed of Dr. Leeanne Rio orders and transferred to schedulers.

## 2019-11-18 NOTE — Telephone Encounter (Signed)
I spoke with pt's ftr and he states contact pt by Cellphone.

## 2019-11-19 ENCOUNTER — Ambulatory Visit: Payer: BC Managed Care – PPO | Admitting: Sports Medicine

## 2019-11-19 ENCOUNTER — Other Ambulatory Visit: Payer: Self-pay

## 2019-11-19 ENCOUNTER — Encounter: Payer: Self-pay | Admitting: Sports Medicine

## 2019-11-19 VITALS — Temp 97.0°F

## 2019-11-19 DIAGNOSIS — M722 Plantar fascial fibromatosis: Secondary | ICD-10-CM

## 2019-11-19 DIAGNOSIS — M79673 Pain in unspecified foot: Secondary | ICD-10-CM | POA: Diagnosis not present

## 2019-11-19 DIAGNOSIS — M7662 Achilles tendinitis, left leg: Secondary | ICD-10-CM

## 2019-11-19 DIAGNOSIS — M7989 Other specified soft tissue disorders: Secondary | ICD-10-CM | POA: Diagnosis not present

## 2019-11-19 MED ORDER — PREDNISONE 10 MG (21) PO TBPK: ORAL_TABLET | ORAL | 0 refills | Status: DC

## 2019-11-19 NOTE — Patient Instructions (Signed)
Pre-Operative Instructions  Congratulations, you have decided to take an important step towards improving your quality of life.  You can be assured that the doctors and staff at [facility] will be with you every step of the way.  Here are some important things you should know:  1. Plan to be at the surgery center/hospital at least 1 (one) hour prior to your scheduled time, unless otherwise directed by the surgical center/hospital staff.  You must have a responsible adult accompany you, remain during the surgery and drive you home.  Make sure you have directions to the surgical center/hospital to ensure you arrive on time. 2. If you are having surgery at [facility] or [facility] hospitals, you will need a copy of your medical history and physical form from your family physician within one month prior to the date of surgery. We will give you a form for your primary physician to complete.  3. We make every effort to accommodate the date you request for surgery.  However, there are times where surgery dates or times have to be moved.  We will contact you as soon as possible if a change in schedule is required.   4. No aspirin/ibuprofen for one week before surgery.  If you are on aspirin, any non-steroidal anti-inflammatory medications (Mobic, Aleve, Ibuprofen) should not be taken seven (7) days prior to your surgery.  You make take Tylenol for pain prior to surgery.  5. Medications - If you are taking daily heart and blood pressure medications, seizure, reflux, allergy, asthma, anxiety, pain or diabetes medications, make sure you notify the surgery center/hospital before the day of surgery so they can tell you which medications you should take or avoid the day of surgery. 6. No food or drink after midnight the night before surgery unless directed otherwise by surgical center/hospital staff. 7. No alcoholic beverages 24-hours prior to surgery.  No smoking 24-hours prior or 24-hours after  surgery. 8. Wear loose pants or shorts. They should be loose enough to fit over bandages, boots, and casts. 9. Don't wear slip-on shoes. Sneakers are preferred. 10. Bring your boot with you to the surgery center/hospital.  Also bring crutches or a walker if your physician has prescribed it for you.  If you do not have this equipment, it will be provided for you after surgery. 11. If you have not been contacted by the surgery center/hospital by the day before your surgery, call to confirm the date and time of your surgery. 12. Leave-time from work may vary depending on the type of surgery you have.  Appropriate arrangements should be made prior to surgery with your employer. 13. Prescriptions will be provided immediately following surgery by your doctor.  Fill these as soon as possible after surgery and take the medication as directed. Pain medications will not be refilled on weekends and must be approved by the doctor. 14. Remove nail polish on the operative foot and avoid getting pedicures prior to surgery. 15. Wash the night before surgery.  The night before surgery wash the foot and leg well with water and the antibacterial soap provided. Be sure to pay special attention to beneath the toenails and in between the toes.  Wash for at least three (3) minutes. Rinse thoroughly with water and dry well with a towel.  Perform this wash unless told not to do so by your physician.  Enclosed: 1 Ice pack (please put in freezer the night before surgery)   1 Hibiclens skin cleaner     Pre-op instructions  If you have any questions regarding the instructions, please do not hesitate to call our office.  [place]: [address], [place], [place] [address] -- [phone]  [place]: [address] [place], [place] [address] -- [phone]  [place]: [address], [place], [place] [address] -- [phone]   Website: [email or web address] 

## 2019-11-19 NOTE — Progress Notes (Signed)
Subjective: Katie Branch is a 49 y.o. female patient returns to office with complaint of arch pain L>R. Reports that in addition she has some pain at the back of her heel on the left with a pulling sensation. Patient denies trauma or injury or instability. Denies any lateral ankle pain. Denies any other pedal complaints.    Patient Active Problem List   Diagnosis Date Noted  . Essential hypertension 01/04/2017  . Gastroesophageal reflux disease without esophagitis 01/04/2017  . Fibromyalgia 01/03/2017  . Other fatigue 01/03/2017  . Primary insomnia 01/03/2017  . Plantar fasciitis 01/03/2017    Current Outpatient Medications on File Prior to Visit  Medication Sig Dispense Refill  . baclofen (LIORESAL) 10 MG tablet Take 1 tablet (10 mg total) by mouth 2 (two) times daily as needed for muscle spasms. 60 each 0  . DULoxetine (CYMBALTA) 60 MG capsule Take 1 capsule (60 mg total) by mouth daily. 30 capsule 2  . lisinopril (PRINIVIL,ZESTRIL) 20 MG tablet Take 20 mg by mouth daily.   1  . NON FORMULARY [facility] Scar Cream: Verapamil 10%; Pentoxifylline 5% - for scar on Right Foot and Fibroma on Left Foot Apply 1 to 2 gm on affected areas 3-4x daily prn - total in tube = 200 gm  Faxed over to [place] on 09/17/19 Refills are -- prn    . rosuvastatin (CRESTOR) 20 MG tablet Take 20 mg by mouth daily.    Marland Kitchen topiramate (TOPAMAX) 25 MG tablet Take 25 mg by mouth 2 (two) times daily.     No current facility-administered medications on file prior to visit.    Allergies  Allergen Reactions  . Penicillins    Social History   Socioeconomic History  . Marital status: Single    Spouse name: Not on file  . Number of children: Not on file  . Years of education: Not on file  . Highest education level: Not on file  Occupational History  . Not on file  Tobacco Use  . Smoking status: Never Smoker  . Smokeless tobacco: Never Used  Substance and Sexual Activity  . Alcohol  use: Yes    Comment: once a month  . Drug use: Never  . Sexual activity: Not on file  Other Topics Concern  . Not on file  Social History Narrative  . Not on file   Social Determinants of Health   Financial Resource Strain:   . Difficulty of Paying Living Expenses: Not on file  Food Insecurity:   . Worried About Charity fundraiser in the Last Year: Not on file  . Ran Out of Food in the Last Year: Not on file  Transportation Needs:   . Lack of Transportation (Medical): Not on file  . Lack of Transportation (Non-Medical): Not on file  Physical Activity:   . Days of Exercise per Week: Not on file  . Minutes of Exercise per Session: Not on file  Stress:   . Feeling of Stress : Not on file  Social Connections:   . Frequency of Communication with Friends and Family: Not on file  . Frequency of Social Gatherings with Friends and Family: Not on file  . Attends Religious Services: Not on file  . Active Member of Clubs or Organizations: Not on file  . Attends Archivist Meetings: Not on file  . Marital Status: Not on file  Intimate Partner Violence:   . Fear of Current or Ex-Partner: Not on file  . Emotionally  Abused: Not on file  . Physically Abused: Not on file  . Sexually Abused: Not on file   Family History  Problem Relation Age of Onset  . Cancer Mother        pancreatic   . Heart disease Father   . COPD Father    Past Surgical History:  Procedure Laterality Date  . FOOT SURGERY      Objective: Physical Exam General: The patient is alert and oriented x3 in no acute distress.  Dermatology: Skin is warm, dry and supple bilateral lower extremities. Nails 1-10 are normal. There is no erythema, edema, no eccymosis, no open lesions present. Raised soft tissue mass in arch on left, old scar at right arch unchanged from prior, Integument is otherwise unremarkable.  Vascular: Dorsalis Pedis pulse and Posterior Tibial pulse are 2/4 bilateral. Capillary fill time is  immediate to all digits.  Neurological: Grossly intact to light touch with an achilles reflex of +2/5 and a negative Tinel's sign bilateral.  Musculoskeletal: Tenderness to palpation at the soft tissue mass at arch on left. There is decreased Ankle joint range of motion bilateral and mild pain at achilles insertion on left. All other joints range of motion within normal limits bilateral. Strength 5/5 in all groups bilateral.  No pain to left lateral ankle.    Assessment and Plan: Problem List Items Addressed This Visit      Musculoskeletal and Integument   Plantar fasciitis    Other Visit Diagnoses    Plantar fascial fibromatosis of left foot    -  Primary   Arch pain, unspecified laterality       Soft tissue mass       Tendonitis, Achilles, left         -Complete examination performed.  -MRI  reviewed -Discussed with patient in detail the condition of plantar fibroma/soft tissue mass on left and scar on right and achilles tendonitis, how this occurs and general treatment options. Explained both conservative and surgical treatments.  -Patient opt for surgical management. Consent obtained for excision of fibroma left Pre and Post op course explained. Risks, benefits, alternatives explained. No guarantees given or implied. Surgical booking slip submitted and provided patient with Surgical packet and info for [place] -Requested for Knee scooter -Recommend FMLA for 8 weeks minimum  -Rx verapimil topically to use to both areas  -Dispensed heel padding  -Rx prednisone to take for pain and inflammation  -Patient to return to office after surgery or sooner if problems or questions arise.  Landis Martins, DPM

## 2019-11-20 ENCOUNTER — Telehealth: Payer: Self-pay | Admitting: Sports Medicine

## 2019-11-20 ENCOUNTER — Ambulatory Visit: Payer: BC Managed Care – PPO | Admitting: Rheumatology

## 2019-11-20 NOTE — Telephone Encounter (Addendum)
DOS: 12/02/2019   SURGICAL PROCEDURE: Plantar Fibroma [id number])  BCBS Policy Effective : [id number]  -  10/23/9998   Member Liability Summary       In-Network   Max Per Benefit Period Year-to-Date Remaining     CoInsurance         Deductible $3,000.00  $3,000.00     Out-Of-Pocket 3 $6,000.00 $5,942.00 3 Out-of-Pocket includes copay, deductible, and coinsurance.  [facility]     In-Network Copay Coinsurance  Authorization Required Not Applicable [id number]   Yes Messages: IF AIM SURGICAL UM REVIEW PROCEDURES ON [email or web address] CONTACT AIM FACILITY BENEFIT IF AIM MUSCULOSKELETAL PROGRAM PROCEDURES ON [email or web address] CONTACT AIM. Benefit:  10/25/2019 - 10/23/9998  Per Sy S no prior authorization or referral are required. Call ref# [id number].

## 2019-11-25 ENCOUNTER — Other Ambulatory Visit: Payer: Self-pay | Admitting: *Deleted

## 2019-11-25 DIAGNOSIS — M722 Plantar fascial fibromatosis: Secondary | ICD-10-CM

## 2019-11-25 HISTORY — PX: FOOT SURGERY: SHX648

## 2019-11-25 NOTE — Progress Notes (Signed)
Per Dr. Cannon Kettle I put in an order for a knee scooter for the patient.  She will be non-weight bearing postoperative.  I requested assistance from Jolee Ewing with [facility].

## 2019-12-01 ENCOUNTER — Other Ambulatory Visit: Payer: Self-pay | Admitting: Sports Medicine

## 2019-12-01 DIAGNOSIS — Z9889 Other specified postprocedural states: Secondary | ICD-10-CM

## 2019-12-01 NOTE — Progress Notes (Signed)
Post op meds entered 

## 2019-12-02 ENCOUNTER — Encounter: Payer: Self-pay | Admitting: Sports Medicine

## 2019-12-02 DIAGNOSIS — D492 Neoplasm of unspecified behavior of bone, soft tissue, and skin: Secondary | ICD-10-CM | POA: Diagnosis not present

## 2019-12-02 MED ORDER — IBUPROFEN 800 MG PO TABS: 800.0000 mg | ORAL_TABLET | Freq: Three times a day (TID) | ORAL | 0 refills | Status: DC | PRN

## 2019-12-02 MED ORDER — DOCUSATE SODIUM 100 MG PO CAPS: 100.0000 mg | ORAL_CAPSULE | Freq: Two times a day (BID) | ORAL | 0 refills | Status: DC

## 2019-12-02 MED ORDER — PROMETHAZINE HCL 25 MG PO TABS: 25.0000 mg | ORAL_TABLET | Freq: Three times a day (TID) | ORAL | 0 refills | Status: DC | PRN

## 2019-12-02 MED ORDER — HYDROCODONE-ACETAMINOPHEN 10-325 MG PO TABS: 1.0000 | ORAL_TABLET | Freq: Four times a day (QID) | ORAL | 0 refills | Status: AC | PRN

## 2019-12-03 ENCOUNTER — Telehealth: Payer: Self-pay | Admitting: Sports Medicine

## 2019-12-03 NOTE — Telephone Encounter (Signed)
Post op check phone call made to patient. Patient did not answer, left voicemail advising patient to call office if she has any post op concerns or questions. Call back # provided.  -Dr. Cannon Kettle

## 2019-12-10 ENCOUNTER — Encounter: Payer: Self-pay | Admitting: Sports Medicine

## 2019-12-10 ENCOUNTER — Other Ambulatory Visit: Payer: Self-pay

## 2019-12-10 ENCOUNTER — Ambulatory Visit (INDEPENDENT_AMBULATORY_CARE_PROVIDER_SITE_OTHER): Payer: BC Managed Care – PPO | Admitting: Sports Medicine

## 2019-12-10 VITALS — Temp 98.3°F

## 2019-12-10 DIAGNOSIS — M79672 Pain in left foot: Secondary | ICD-10-CM

## 2019-12-10 DIAGNOSIS — Z9889 Other specified postprocedural states: Secondary | ICD-10-CM

## 2019-12-10 DIAGNOSIS — M722 Plantar fascial fibromatosis: Secondary | ICD-10-CM

## 2019-12-10 NOTE — Progress Notes (Signed)
Subjective: Katie Branch is a 49 y.o. female patient seen today in office for POV #1 (DOS 12-02-19), S/P excision of fibroma left foot. Patient admits a little pain at surgical site, denies calf pain, denies headache, chest pain, shortness of breath, nausea, vomiting, fever, or chills. Patient states that she is doing ok and will go for knee scooter today. No other issues noted.   Patient Active Problem List   Diagnosis Date Noted  . Essential hypertension 01/04/2017  . Gastroesophageal reflux disease without esophagitis 01/04/2017  . Fibromyalgia 01/03/2017  . Other fatigue 01/03/2017  . Primary insomnia 01/03/2017  . Plantar fasciitis 01/03/2017    Current Outpatient Medications on File Prior to Visit  Medication Sig Dispense Refill  . baclofen (LIORESAL) 10 MG tablet Take 1 tablet (10 mg total) by mouth 2 (two) times daily as needed for muscle spasms. 60 each 0  . docusate sodium (COLACE) 100 MG capsule Take 1 capsule (100 mg total) by mouth 2 (two) times daily. 10 capsule 0  . DULoxetine (CYMBALTA) 60 MG capsule Take 1 capsule (60 mg total) by mouth daily. 30 capsule 2  . ibuprofen (ADVIL) 800 MG tablet Take 1 tablet (800 mg total) by mouth every 8 (eight) hours as needed. 30 tablet 0  . lisinopril (PRINIVIL,ZESTRIL) 20 MG tablet Take 20 mg by mouth daily.   1  . NON FORMULARY [facility] Scar Cream: Verapamil 10%; Pentoxifylline 5% - for scar on Right Foot and Fibroma on Left Foot Apply 1 to 2 gm on affected areas 3-4x daily prn - total in tube = 200 gm  Faxed over to [place] on 09/17/19 Refills are -- prn    . predniSONE (STERAPRED UNI-PAK 21 TAB) 10 MG (21) TBPK tablet Take as directed 21 tablet 0  . promethazine (PHENERGAN) 25 MG tablet Take 1 tablet (25 mg total) by mouth every 8 (eight) hours as needed for nausea or vomiting. 20 tablet 0  . rosuvastatin (CRESTOR) 20 MG tablet Take 20 mg by mouth daily.    Marland Kitchen topiramate (TOPAMAX) 25 MG tablet Take 25 mg by  mouth 2 (two) times daily.     No current facility-administered medications on file prior to visit.    Allergies  Allergen Reactions  . Penicillins     Objective: There were no vitals filed for this visit.  General: No acute distress, AAOx3  Left foot: Sutures intact with no gapping or dehiscence at surgical site, mild swelling to left plantar arch, no erythema, no warmth, no drainage, no signs of infection noted, Capillary fill time <3 seconds in all digits, gross sensation present via light touch to left foot. No pain or crepitation with range of motion left foot.  No pain with calf compression.   Assessment and Plan:  Problem List Items Addressed This Visit    None    Visit Diagnoses    S/P foot surgery, left    -  Primary   Plantar fascial fibromatosis of left foot       Left foot pain           -Patient seen and evaluated -Applied dry sterile dressing to surgical site left foot secured with ACE wrap and stockinet  -Advised patient to make sure to keep dressings clean, dry, and intact to left surgical site, removing the ACE as needed  -Advised patient to continue with CAM boot and crutches to remain NWB on left -Patient to go for knee scooter today  -Advised patient to  limit activity to necessity  -Advised patient to ice and elevate as necessary  -Continue with PRN meds -Will plan for possible suture removal with application of topical lidocaine at next office visit. In the meantime, patient to call office if any issues or problems arise.   Landis Martins, DPM

## 2019-12-17 ENCOUNTER — Other Ambulatory Visit: Payer: Self-pay

## 2019-12-17 ENCOUNTER — Encounter: Payer: Self-pay | Admitting: Sports Medicine

## 2019-12-17 ENCOUNTER — Ambulatory Visit (INDEPENDENT_AMBULATORY_CARE_PROVIDER_SITE_OTHER): Payer: Self-pay | Admitting: Sports Medicine

## 2019-12-17 VITALS — Temp 97.3°F

## 2019-12-17 DIAGNOSIS — Z9889 Other specified postprocedural states: Secondary | ICD-10-CM

## 2019-12-17 DIAGNOSIS — M79676 Pain in unspecified toe(s): Secondary | ICD-10-CM

## 2019-12-17 DIAGNOSIS — M79672 Pain in left foot: Secondary | ICD-10-CM

## 2019-12-17 DIAGNOSIS — M722 Plantar fascial fibromatosis: Secondary | ICD-10-CM

## 2019-12-17 NOTE — Progress Notes (Signed)
Subjective: Katie Branch is a 49 y.o. female patient seen today in office for POV #2 (DOS 12-02-19), S/P excision of fibroma left foot. Patient admits a little pain at surgical site at night of tingling to surgical site, denies calf pain, denies headache, chest pain, shortness of breath, nausea, vomiting, fever, or chills. Using Knee scooter without issues.  No other issues noted.   Patient Active Problem List   Diagnosis Date Noted  . Essential hypertension 01/04/2017  . Gastroesophageal reflux disease without esophagitis 01/04/2017  . Fibromyalgia 01/03/2017  . Other fatigue 01/03/2017  . Primary insomnia 01/03/2017  . Plantar fasciitis 01/03/2017    Current Outpatient Medications on File Prior to Visit  Medication Sig Dispense Refill  . baclofen (LIORESAL) 10 MG tablet Take 1 tablet (10 mg total) by mouth 2 (two) times daily as needed for muscle spasms. 60 each 0  . docusate sodium (COLACE) 100 MG capsule Take 1 capsule (100 mg total) by mouth 2 (two) times daily. 10 capsule 0  . DULoxetine (CYMBALTA) 60 MG capsule Take 1 capsule (60 mg total) by mouth daily. 30 capsule 2  . ibuprofen (ADVIL) 800 MG tablet Take 1 tablet (800 mg total) by mouth every 8 (eight) hours as needed. 30 tablet 0  . lisinopril (PRINIVIL,ZESTRIL) 20 MG tablet Take 20 mg by mouth daily.   1  . NON FORMULARY [facility] Scar Cream: Verapamil 10%; Pentoxifylline 5% - for scar on Right Foot and Fibroma on Left Foot Apply 1 to 2 gm on affected areas 3-4x daily prn - total in tube = 200 gm  Faxed over to [place] on 09/17/19 Refills are -- prn    . predniSONE (STERAPRED UNI-PAK 21 TAB) 10 MG (21) TBPK tablet Take as directed 21 tablet 0  . promethazine (PHENERGAN) 25 MG tablet Take 1 tablet (25 mg total) by mouth every 8 (eight) hours as needed for nausea or vomiting. 20 tablet 0  . rosuvastatin (CRESTOR) 20 MG tablet Take 20 mg by mouth daily.    Marland Kitchen topiramate (TOPAMAX) 25 MG tablet Take 25 mg by  mouth 2 (two) times daily.     No current facility-administered medications on file prior to visit.    Allergies  Allergen Reactions  . Penicillins     Objective: There were no vitals filed for this visit.  General: No acute distress, AAOx3  Left foot: Sutures intact with no gapping or dehiscence at surgical site, mild swelling to left plantar arch, no erythema, no warmth, no drainage, no signs of infection noted, Capillary fill time <3 seconds in all digits, gross sensation present via light touch to left foot. No pain or crepitation with range of motion left foot.  No pain with calf compression.   Assessment and Plan:  Problem List Items Addressed This Visit    None    Visit Diagnoses    S/P foot surgery, left    -  Primary   Plantar fascial fibromatosis of left foot       Left foot pain           -Patient seen and evaluated -A few sutures were removed  -Applied dry sterile dressing to surgical site left foot secured with ACE wrap and stockinet  -Advised patient to make sure to keep dressings clean, dry, and intact to left surgical site, removing the ACE as needed  -Advised patient to continue with CAM boot and crutches to remain NWB on left with knee scooter -Advised patient to  limit activity to necessity  -Advised patient to ice and elevate as necessary  -Continue with PRN meds -Will plan for finishing suture removal with application of topical lidocaine at next office visit. In the meantime, patient to call office if any issues or problems arise.  -FMLA/Disability paperwork to be completed by Levada Dy for 12 weeks to end 02-29-20  Landis Martins, DPM

## 2019-12-24 ENCOUNTER — Encounter: Payer: Self-pay | Admitting: Sports Medicine

## 2019-12-24 ENCOUNTER — Other Ambulatory Visit: Payer: Self-pay

## 2019-12-24 ENCOUNTER — Ambulatory Visit (INDEPENDENT_AMBULATORY_CARE_PROVIDER_SITE_OTHER): Payer: BC Managed Care – PPO | Admitting: Sports Medicine

## 2019-12-24 DIAGNOSIS — M722 Plantar fascial fibromatosis: Secondary | ICD-10-CM

## 2019-12-24 DIAGNOSIS — Z9889 Other specified postprocedural states: Secondary | ICD-10-CM

## 2019-12-24 DIAGNOSIS — M79672 Pain in left foot: Secondary | ICD-10-CM

## 2019-12-24 NOTE — Progress Notes (Signed)
Subjective: Katie Branch is a 49 y.o. female patient seen today in office for POV # 3 (DOS 12-02-19), S/P excision of fibroma left foot. Patient denies pain at surgical site doing really good, denies headache, chest pain, shortness of breath, nausea, vomiting, fever, or chills. Using Knee scooter without issues.  No other issues noted.   Patient Active Problem List   Diagnosis Date Noted  . Essential hypertension 01/04/2017  . Gastroesophageal reflux disease without esophagitis 01/04/2017  . Fibromyalgia 01/03/2017  . Other fatigue 01/03/2017  . Primary insomnia 01/03/2017  . Plantar fasciitis 01/03/2017    Current Outpatient Medications on File Prior to Visit  Medication Sig Dispense Refill  . baclofen (LIORESAL) 10 MG tablet Take 1 tablet (10 mg total) by mouth 2 (two) times daily as needed for muscle spasms. 60 each 0  . docusate sodium (COLACE) 100 MG capsule Take 1 capsule (100 mg total) by mouth 2 (two) times daily. 10 capsule 0  . DULoxetine (CYMBALTA) 60 MG capsule Take 1 capsule (60 mg total) by mouth daily. 30 capsule 2  . ibuprofen (ADVIL) 800 MG tablet Take 1 tablet (800 mg total) by mouth every 8 (eight) hours as needed. 30 tablet 0  . lisinopril (PRINIVIL,ZESTRIL) 20 MG tablet Take 20 mg by mouth daily.   1  . NON FORMULARY  [facility] Scar Cream: Verapamil 10%; Pentoxifylline 5% - for scar on Right Foot and Fibroma on Left Foot Apply 1 to 2 gm on affected areas 3-4x daily prn - total in tube = 200 gm  Faxed over to [place] on 09/17/19 Refills are -- prn    . predniSONE (STERAPRED UNI-PAK 21 TAB) 10 MG (21) TBPK tablet Take as directed 21 tablet 0  . promethazine (PHENERGAN) 25 MG tablet Take 1 tablet (25 mg total) by mouth every 8 (eight) hours as needed for nausea or vomiting. 20 tablet 0  . rosuvastatin (CRESTOR) 20 MG tablet Take 20 mg by mouth daily.    Marland Kitchen topiramate (TOPAMAX) 25 MG tablet Take 25 mg by mouth 2 (two) times daily.     No current  facility-administered medications on file prior to visit.    Allergies  Allergen Reactions  . Penicillins     Objective: There were no vitals filed for this visit.  General: No acute distress, AAOx3  Left foot: Sutures intact with no gapping or dehiscence at surgical site, mild swelling to left plantar arch, no erythema, no warmth, no drainage, no signs of infection noted, Capillary fill time <3 seconds in all digits, gross sensation present via light touch to left foot. No pain or crepitation with range of motion left foot.  No pain with calf compression.   Assessment and Plan:  Problem List Items Addressed This Visit    None    Visit Diagnoses    S/P foot surgery, left    -  Primary   Plantar fascial fibromatosis of left foot       Left foot pain           -Patient seen and evaluated -Remaining sutures were removed  -Applied dry sterile dressing to surgical site left foot secured with ACE wrap and stockinet  -Advised patient may remove dressing on tomorrow for a shower -May wean from scooter and may weight-bear with cam boot -Advised patient to limit activity to necessity  -Advised patient to ice and elevate as necessary  -Continue with PRN meds -Will plan for transitioning out of cam boot at next  visit if she is doing well -FMLA/Disability to end 02/29/2020  Landis Martins, DPM

## 2019-12-31 ENCOUNTER — Encounter: Payer: BC Managed Care – PPO | Admitting: Sports Medicine

## 2020-01-07 ENCOUNTER — Other Ambulatory Visit: Payer: Self-pay

## 2020-01-07 ENCOUNTER — Ambulatory Visit (INDEPENDENT_AMBULATORY_CARE_PROVIDER_SITE_OTHER): Payer: BC Managed Care – PPO | Admitting: Sports Medicine

## 2020-01-07 ENCOUNTER — Encounter: Payer: Self-pay | Admitting: Sports Medicine

## 2020-01-07 VITALS — Temp 98.0°F

## 2020-01-07 DIAGNOSIS — M722 Plantar fascial fibromatosis: Secondary | ICD-10-CM

## 2020-01-07 DIAGNOSIS — M79672 Pain in left foot: Secondary | ICD-10-CM

## 2020-01-07 DIAGNOSIS — Z9889 Other specified postprocedural states: Secondary | ICD-10-CM

## 2020-01-07 NOTE — Progress Notes (Signed)
Subjective: Katie Branch is a 49 y.o. female patient seen today in office for POV # 4 (DOS 12-02-19), S/P excision of fibroma left foot. Patient denies pain at surgical site but feels like there is a "mound" there when trying to walk, denies headache, chest pain, shortness of breath, nausea, vomiting, fever, or chills. No other issues noted.   Patient Active Problem List   Diagnosis Date Noted  . Essential hypertension 01/04/2017  . Gastroesophageal reflux disease without esophagitis 01/04/2017  . Fibromyalgia 01/03/2017  . Other fatigue 01/03/2017  . Primary insomnia 01/03/2017  . Plantar fasciitis 01/03/2017    Current Outpatient Medications on File Prior to Visit  Medication Sig Dispense Refill  . baclofen (LIORESAL) 10 MG tablet Take 1 tablet (10 mg total) by mouth 2 (two) times daily as needed for muscle spasms. 60 each 0  . docusate sodium (COLACE) 100 MG capsule Take 1 capsule (100 mg total) by mouth 2 (two) times daily. 10 capsule 0  . DULoxetine (CYMBALTA) 60 MG capsule Take 1 capsule (60 mg total) by mouth daily. 30 capsule 2  . ibuprofen (ADVIL) 800 MG tablet Take 1 tablet (800 mg total) by mouth every 8 (eight) hours as needed. 30 tablet 0  . lisinopril (PRINIVIL,ZESTRIL) 20 MG tablet Take 20 mg by mouth daily.   1  . NON FORMULARY [facility] Scar Cream: Verapamil 10%; Pentoxifylline 5% - for scar on Right Foot and Fibroma on Left Foot Apply 1 to 2 gm on affected areas 3-4x daily prn - total in tube = 200 gm  Faxed over to [place] on 09/17/19 Refills are -- prn    . predniSONE (STERAPRED UNI-PAK 21 TAB) 10 MG (21) TBPK tablet Take as directed 21 tablet 0  . promethazine (PHENERGAN) 25 MG tablet Take 1 tablet (25 mg total) by mouth every 8 (eight) hours as needed for nausea or vomiting. 20 tablet 0  . rosuvastatin (CRESTOR) 20 MG tablet Take 20 mg by mouth daily.    Marland Kitchen topiramate (TOPAMAX) 25 MG tablet Take 25 mg by mouth 2 (two) times daily.     No  current facility-administered medications on file prior to visit.    Allergies  Allergen Reactions  . Penicillins     Objective: There were no vitals filed for this visit.  General: No acute distress, AAOx3  Left foot: Incision site healing well, mild swelling to left plantar arch, no erythema, no warmth, no drainage, no signs of infection noted, Capillary fill time <3 seconds in all digits, gross sensation present via light touch to left foot. No pain or crepitation with range of motion left foot.  No pain with calf compression.   Assessment and Plan:  Problem List Items Addressed This Visit    None    Visit Diagnoses    S/P foot surgery, left    -  Primary   Plantar fascial fibromatosis of left foot       Left foot pain           -Patient seen and evaluated -Continue with verapimil and scar care -May use ace wrap for swelling  -May wean from boot to tennis shoe -Advised patient to limit activity to necessity  -Advised patient to ice and elevate as necessary  -Continue with PRN meds -Will plan for post op check at next visit if she is doing well -FMLA/Disability to end 02/29/2020 but patient reports that they have her going back on 3-28 so patient will see me  back at that time to see if she is ready to go back to work  [employer], DPM

## 2020-01-21 ENCOUNTER — Other Ambulatory Visit: Payer: Self-pay

## 2020-01-21 ENCOUNTER — Ambulatory Visit (INDEPENDENT_AMBULATORY_CARE_PROVIDER_SITE_OTHER): Payer: BC Managed Care – PPO | Admitting: Sports Medicine

## 2020-01-21 ENCOUNTER — Encounter: Payer: Self-pay | Admitting: Sports Medicine

## 2020-01-21 VITALS — Temp 97.8°F

## 2020-01-21 DIAGNOSIS — Z9889 Other specified postprocedural states: Secondary | ICD-10-CM

## 2020-01-21 DIAGNOSIS — M722 Plantar fascial fibromatosis: Secondary | ICD-10-CM

## 2020-01-21 DIAGNOSIS — M79672 Pain in left foot: Secondary | ICD-10-CM

## 2020-01-21 NOTE — Progress Notes (Signed)
Subjective: Katie Branch is a 49 y.o. female patient seen today in office for POV # 5 (DOS 12-02-19), S/P excision of fibroma left foot. Patient admits a little pain at surgical site 2 out of 10 when walking on foot and feels swollen, denies headache, chest pain, shortness of breath, nausea, vomiting, fever, or chills.  Patient reports that her disability office needs updated paperwork in order for them to reinstate her pay.  No other issues noted.   Patient Active Problem List   Diagnosis Date Noted  . Essential hypertension 01/04/2017  . Gastroesophageal reflux disease without esophagitis 01/04/2017  . Fibromyalgia 01/03/2017  . Other fatigue 01/03/2017  . Primary insomnia 01/03/2017  . Plantar fasciitis 01/03/2017    Current Outpatient Medications on File Prior to Visit  Medication Sig Dispense Refill  . baclofen (LIORESAL) 10 MG tablet Take 1 tablet (10 mg total) by mouth 2 (two) times daily as needed for muscle spasms. 60 each 0  . docusate sodium (COLACE) 100 MG capsule Take 1 capsule (100 mg total) by mouth 2 (two) times daily. 10 capsule 0  . DULoxetine (CYMBALTA) 60 MG capsule Take 1 capsule (60 mg total) by mouth daily. 30 capsule 2  . ibuprofen (ADVIL) 800 MG tablet Take 1 tablet (800 mg total) by mouth every 8 (eight) hours as needed. 30 tablet 0  . lisinopril (PRINIVIL,ZESTRIL) 20 MG tablet Take 20 mg by mouth daily.   1  . NON FORMULARY [facility] Scar Cream: Verapamil 10%; Pentoxifylline 5% - for scar on Right Foot and Fibroma on Left Foot Apply 1 to 2 gm on affected areas 3-4x daily prn - total in tube = 200 gm  Faxed over to [place] on 09/17/19 Refills are -- prn    . promethazine (PHENERGAN) 25 MG tablet Take 1 tablet (25 mg total) by mouth every 8 (eight) hours as needed for nausea or vomiting. 20 tablet 0  . rosuvastatin (CRESTOR) 20 MG tablet Take 20 mg by mouth daily.    Marland Kitchen topiramate (TOPAMAX) 25 MG tablet Take 25 mg by mouth 2 (two) times daily.      No current facility-administered medications on file prior to visit.    Allergies  Allergen Reactions  . Penicillins     Objective: There were no vitals filed for this visit.  General: No acute distress, AAOx3  Left foot: Incision site healing well, mild swelling to left plantar arch, no erythema, no warmth, no drainage, no signs of infection noted, Capillary fill time <3 seconds in all digits, gross sensation present via light touch to left foot. No pain or crepitation with range of motion left foot.  No pain with calf compression.   Assessment and Plan:  Problem List Items Addressed This Visit    None    Visit Diagnoses    S/P foot surgery, left    -  Primary   Plantar fascial fibromatosis of left foot       Left foot pain           -Patient seen and evaluated -Surgical site check performed -Continue with verapimil and scar care -May use ace wrap or compression sleeve for swelling  -Continue with tennis shoes as tolerated -Advised patient to limit activity to necessity  -Advised patient to ice and elevate as necessary for pain and edema control -Continue with PRN meds -Anticipate return to work on 02/29/2020 -Patient to return to office in 1 month for continued postoperative care and follow-up.  Tykia Mellone  Cannon Kettle, DPM

## 2020-01-23 ENCOUNTER — Ambulatory Visit: Payer: BC Managed Care – PPO | Attending: Internal Medicine

## 2020-01-23 DIAGNOSIS — Z23 Encounter for immunization: Secondary | ICD-10-CM

## 2020-01-23 NOTE — Progress Notes (Signed)
   Covid-19 Vaccination Clinic  Name:  Katie Branch    MRN: [id number] DOB: 1971-07-27  01/23/2020  Katie Branch was observed post Covid-19 immunization for 15 minutes without incident. She was provided with Vaccine Information Sheet and instruction to access the V-Safe system.   Katie Branch was instructed to call 911 with any severe reactions post vaccine: Marland Kitchen Difficulty breathing  . Swelling of face and throat  . A fast heartbeat  . A bad rash all over body  . Dizziness and weakness   Immunizations Administered    Name Date Dose VIS Date Route   Moderna COVID-19 Vaccine 01/23/2020 10:35 AM 0.5 mL 09/24/2019 Intramuscular   Manufacturer: Moderna   Lot: [id number]   [id number]

## 2020-02-18 ENCOUNTER — Encounter: Payer: Self-pay | Admitting: Sports Medicine

## 2020-02-18 ENCOUNTER — Other Ambulatory Visit: Payer: Self-pay

## 2020-02-18 ENCOUNTER — Ambulatory Visit (INDEPENDENT_AMBULATORY_CARE_PROVIDER_SITE_OTHER): Payer: BC Managed Care – PPO | Admitting: Sports Medicine

## 2020-02-18 VITALS — Temp 97.0°F

## 2020-02-18 DIAGNOSIS — M79672 Pain in left foot: Secondary | ICD-10-CM

## 2020-02-18 DIAGNOSIS — Z9889 Other specified postprocedural states: Secondary | ICD-10-CM

## 2020-02-18 DIAGNOSIS — M722 Plantar fascial fibromatosis: Secondary | ICD-10-CM

## 2020-02-18 NOTE — Progress Notes (Signed)
Subjective: Katie Branch is a 49 y.o. female patient seen today in office for POV # 6 (DOS 12-02-19), S/P excision of fibroma left foot. Patient admits a little swelling and scar but using cream, denies headache, chest pain, shortness of breath, nausea, vomiting, fever, or chills.   No other issues noted.   Patient Active Problem List   Diagnosis Date Noted  . Essential hypertension 01/04/2017  . Gastroesophageal reflux disease without esophagitis 01/04/2017  . Fibromyalgia 01/03/2017  . Other fatigue 01/03/2017  . Primary insomnia 01/03/2017  . Plantar fasciitis 01/03/2017    Current Outpatient Medications on File Prior to Visit  Medication Sig Dispense Refill  . baclofen (LIORESAL) 10 MG tablet Take 1 tablet (10 mg total) by mouth 2 (two) times daily as needed for muscle spasms. 60 each 0  . docusate sodium (COLACE) 100 MG capsule Take 1 capsule (100 mg total) by mouth 2 (two) times daily. 10 capsule 0  . DULoxetine (CYMBALTA) 60 MG capsule Take 1 capsule (60 mg total) by mouth daily. 30 capsule 2  . ibuprofen (ADVIL) 800 MG tablet Take 1 tablet (800 mg total) by mouth every 8 (eight) hours as needed. 30 tablet 0  . lisinopril (PRINIVIL,ZESTRIL) 20 MG tablet Take 20 mg by mouth daily.   1  . NON FORMULARY [facility] Scar Cream: Verapamil 10%; Pentoxifylline 5% - for scar on Right Foot and Fibroma on Left Foot Apply 1 to 2 gm on affected areas 3-4x daily prn - total in tube = 200 gm  Faxed over to [place] on 09/17/19 Refills are -- prn    . rosuvastatin (CRESTOR) 20 MG tablet Take 20 mg by mouth daily.    Marland Kitchen topiramate (TOPAMAX) 25 MG tablet Take 25 mg by mouth 2 (two) times daily.     No current facility-administered medications on file prior to visit.    Allergies  Allergen Reactions  . Penicillins     Objective: There were no vitals filed for this visit.  General: No acute distress, AAOx3  Left foot: Incision site healed, mild swelling to left plantar  arch, no erythema, no warmth, no drainage, no signs of infection noted, Capillary fill time <3 seconds in all digits, gross sensation present via light touch to left foot. No pain or crepitation with range of motion left foot.  No pain with calf compression.   Assessment and Plan:  Problem List Items Addressed This Visit    None    Visit Diagnoses    S/P foot surgery, left    -  Primary   Plantar fascial fibromatosis of left foot       Left foot pain           -Patient seen and evaluated -Surgical site check performed -Continue with verapimil and scar care like before -May use ace wrap or compression sleeve for swelling as dispensed  -Continue with tennis shoes as tolerated -Advised patient to limit activity to necessity  -Advised patient to ice and elevate as necessary for pain and edema control -Return to work 02/29/20 -Patient to return to office as needed for follow-up.  Landis Martins, DPM

## 2020-02-25 ENCOUNTER — Ambulatory Visit: Payer: BC Managed Care – PPO | Attending: Internal Medicine

## 2020-02-25 DIAGNOSIS — Z23 Encounter for immunization: Secondary | ICD-10-CM

## 2020-02-25 NOTE — Progress Notes (Signed)
   Covid-19 Vaccination Clinic  Name:  Katie Branch    MRN: [id number] DOB: 1971/10/06  02/25/2020  Ms. Katie Branch was observed post Covid-19 immunization for 15 minutes without incident. She was provided with Vaccine Information Sheet and instruction to access the V-Safe system.   Ms. Katie Branch was instructed to call 911 with any severe reactions post vaccine: Marland Kitchen Difficulty breathing  . Swelling of face and throat  . A fast heartbeat  . A bad rash all over body  . Dizziness and weakness   Immunizations Administered    Name Date Dose VIS Date Route   Moderna COVID-19 Vaccine 02/25/2020  9:56 AM 0.5 mL 09/2019 Intramuscular   Manufacturer: Moderna   Lot: [id number]   [id number]

## 2020-05-06 NOTE — Progress Notes (Signed)
Office Visit Note  Patient: Katie Branch             Date of Birth: [birth year]           MRN: [id number]             PCP: Monico Blitz, MD Referring: Monico Blitz, MD Visit Date: 05/12/2020 Occupation: @GUAROCC @  Subjective:  Low back pain   History of Present Illness: SEVIN FARONE is a 49 y.o. female with history of fibromyalgia and osteoarthritis.  Patient presents today with ongoing lower back pain and trochanteric bursitis bilaterally.  She has a physically demanding job and walks up to 5 to 6 miles on a daily basis which exacerbates her lower back.  She continues to have significant left SI joint pain.  She states that last week her lower extremities felt weak and achy.  She also reports she is having increased left shoulder pain.  She states that about a week ago she was swatting a bug which exacerbated her left shoulder pain.  She has been experiencing nocturnal pain intermittently.  She has not been taking any over-the-counter products for pain relief.  She continues to take Cymbalta 60 mg 1 capsule by mouth daily and baclofen 10 mg twice daily as needed for muscle spasms.  She has been taking baclofen sparingly.  She continues to have chronic fatigue secondary to insomnia.  She sleeps about 5 hours per night.  Activities of Daily Living:  Patient reports morning stiffness for 1  hour.   Patient Reports nocturnal pain.  Difficulty dressing/grooming: Denies Difficulty climbing stairs: Reports Difficulty getting out of chair: Reports Difficulty using hands for taps, buttons, cutlery, and/or writing: Denies  Review of Systems  Constitutional: Positive for fatigue.  HENT: Negative for mouth sores, mouth dryness and nose dryness.   Eyes: Negative for pain, itching, visual disturbance and dryness.  Respiratory: Negative for cough, hemoptysis, shortness of breath and difficulty breathing.   Cardiovascular: Negative for chest pain, palpitations, hypertension and swelling in legs/feet.   Gastrointestinal: Negative for blood in stool, constipation and diarrhea.  Endocrine: Negative for increased urination.  Genitourinary: Negative for difficulty urinating and painful urination.  Musculoskeletal: Positive for arthralgias, joint pain, myalgias, morning stiffness, muscle tenderness and myalgias. Negative for joint swelling and muscle weakness.  Skin: Negative for color change, pallor, rash, hair loss, nodules/bumps, redness, skin tightness, ulcers and sensitivity to sunlight.  Allergic/Immunologic: Negative for susceptible to infections.  Neurological: Positive for weakness. Negative for dizziness, numbness, headaches and memory loss.  Hematological: Positive for bruising/bleeding tendency. Negative for swollen glands.  Psychiatric/Behavioral: Negative for depressed mood, confusion and sleep disturbance. The patient is not nervous/anxious.     PMFS History:  Patient Active Problem List   Diagnosis Date Noted  . Essential hypertension 01/04/2017  . Gastroesophageal reflux disease without esophagitis 01/04/2017  . Fibromyalgia 01/03/2017  . Other fatigue 01/03/2017  . Primary insomnia 01/03/2017  . Plantar fasciitis 01/03/2017    Past Medical History:  Diagnosis Date  . Fatigue   . Fibromyalgia   . GERD (gastroesophageal reflux disease)   . Hypertension   . Plantar fasciitis     Family History  Problem Relation Age of Onset  . Cancer Mother        pancreatic   . Heart disease Father   . COPD Father    Past Surgical History:  Procedure Laterality Date  . FOOT SURGERY Left 11/25/2019   Social History   Social History Narrative  .  Not on file   Immunization History  Administered Date(s) Administered  . Influenza Inj Mdck Quad Pf 08/20/2019  . Influenza-Unspecified 08/28/2019  . Moderna SARS-COVID-2 Vaccination 01/23/2020, 02/25/2020     Objective: Vital Signs: BP (!) 141/88 (BP Location: Left Arm, Patient Position: Sitting, Cuff Size: Large)   Pulse 72    Resp 17   Ht 5\' 5"  (1.651 m)   Wt 214 lb 3.2 oz (97.2 kg)   BMI 35.64 kg/m    Physical Exam Vitals and nursing note reviewed.  Constitutional:      Appearance: She is well-developed.  HENT:     Head: Normocephalic and atraumatic.  Eyes:     Conjunctiva/sclera: Conjunctivae normal.  Pulmonary:     Effort: Pulmonary effort is normal.  Abdominal:     General: Bowel sounds are normal.     Palpations: Abdomen is soft.  Musculoskeletal:     Cervical back: Normal range of motion.  Lymphadenopathy:     Cervical: No cervical adenopathy.  Skin:    General: Skin is warm and dry.     Capillary Refill: Capillary refill takes less than 2 seconds.  Neurological:     Mental Status: She is alert and oriented to person, place, and time.  Psychiatric:        Behavior: Behavior normal.      Musculoskeletal Exam: C-spine, thoracic spine, and lumbar spine good ROM.  Left SI joint tenderness.  Shoulder joints, elbow joints, wrist joints, MCPs, PIPs, and DIPs good ROM with no synovitis.  Complete fist formation bilaterally.  Hip joints, knee joints, ankle joints, MTPs, PIPs, and DIPs good ROM with no synovitis.  No warmth or effusion of knee joints.  No tenderness or swelling of ankle joints.  Tenderness over bilateral trochanteric bursa.   CDAI Exam: CDAI Score: -- Patient Global: --; Provider Global: -- Swollen: --; Tender: -- Joint Exam 05/12/2020   No joint exam has been documented for this visit   There is currently no information documented on the homunculus. Go to the Rheumatology activity and complete the homunculus joint exam.  Investigation: No additional findings.  Imaging: XR Lumbar Spine 2-3 Views  Result Date: 05/12/2020 Mild levoscoliosis was noted.  Anterior spurring was noted.  L1-L2 narrowing was noted.  Facet joint arthropathy was noted. Impression: These findings are consistent with lumbar spondylosis and facet joint arthropathy.  XR Shoulder Left  Result Date:  05/12/2020 No glenohumeral or acromioclavicular narrowing was noted.  No chondrocalcinosis was noted. Impression: Unremarkable x-ray of the shoulder joint.   Recent Labs: No results found for: WBC, HGB, PLT, NA, K, CL, CO2, GLUCOSE, BUN, CREATININE, BILITOT, ALKPHOS, AST, ALT, PROT, ALBUMIN, CALCIUM, GFRAA, QFTBGOLD, QFTBGOLDPLUS  Speciality Comments: No specialty comments available.  Procedures:  Sacroiliac Joint Inj on 05/12/2020 10:21 AM Indications: pain Details: 27 G 1.5 in needle, posterior approach Medications: 1 mL lidocaine 1 %; 40 mg triamcinolone acetonide 40 MG/ML Aspirate: 0 mL Outcome: tolerated well, no immediate complications Procedure, treatment alternatives, risks and benefits explained, specific risks discussed. Consent was given by the patient. Immediately prior to procedure a time out was called to verify the correct patient, procedure, equipment, support staff and site/side marked as required. Patient was prepped and draped in the usual sterile fashion.     Allergies: Penicillins   Assessment / Plan:     Visit Diagnoses: Chronic SI joint pain -She presents today with left SI joint tenderness and discomfort.  She has been experiencing increased lower back pain and  intermittent left-sided sciatica.  X-rays of the lumbar spine were obtained today and compared to x-rays from 02/06/2018.  She requested a left SI joint cortisone injection.  She tolerated the procedure well.  Aftercare was discussed.  She is also given a handout of back exercises to perform.  She was advised to notify us if her symptoms persist or worsen.  She will follow-up in the office in 6 months.  Plan: XR Lumbar Spine 2-3 Views  Positive anti-CCP test: No synovitis was noted on exam.  She has no clinical features of rheumatoid arthritis.    Primary osteoarthritis of right knee: She has good range of motion of the right knee joint on exam.  No warmth or effusion was noted.  Her discomfort has improved  significantly.  She had a right knee joint cortisone junction performed on 10/11/2018 which improved her discomfort.  Fibromyalgia: She experiences generalized myalgias and muscle tenderness due to fibromyalgia.  She has ongoing lower back pain and trochanter bursitis bilaterally.  She experiences nocturnal pain on occasion.  She continues to take Cymbalta 60 mg 1 capsule by mouth daily and baclofen 10 mg twice daily as needed for muscle spasms.  She does not need any refills at this time.  We discussed the importance of regular exercise and good sleep hygiene.  Lateral epicondylitis of both elbows: Resolved.  She had a right lateral epicondyle cortisone injection performed on 10/11/2018 which resolved her symptoms.  Trochanteric bursitis of both hips: She has tenderness over bilateral trochanteric bursa.  She was encouraged to perform stretching exercises on a daily basis.  Chronic midline low back pain with left-sided sciatica - She presents today with increased lower back pain.  She has midline spinal tenderness and tenderness over the left SI joint.  She has been experiencing intermittent symptoms of sciatica.  X-rays of the lumbar spine were obtained today and were compared to x-rays from 02/06/2018.  Patient requested a left CMC joint cortisone injection.  She tolerated the procedure well.  The procedure note was completed above.  Aftercare was discussed.  She was also given a handout of back exercises to perform.  Plan: XR Lumbar Spine 2-3 Views  Acute pain of left shoulder - She has been experiencing discomfort in the left shoulder for about 1 week.  According to the patient she was holding a bag and felt a sharp pain in her left shoulder.  She has good range of motion on examination today.  She has tenderness posteriorly today x-rays of the left shoulder were obtained which were unremarkable.  She was given a handout of shoulder joint exercises to perform.  She was advised to notify us if her  symptoms persist or worsen.  Plan: XR Shoulder Left  Other fatigue: She has chronic fatigue secondary to insomnia.  Primary insomnia: She only sleeps about 5 hours per night.  She has occasional nocturnal pain.  We discussed the importance of good sleep hygiene.  Other medical conditions are listed as follows:   Gastroesophageal reflux disease without esophagitis  Essential hypertension  Plantar fasciitis    Orders: Orders Placed This Encounter  Procedures  . XR Lumbar Spine 2-3 Views  . XR Shoulder Left   No orders of the defined types were placed in this encounter.   Face-to-face time spent with patient was 30 minutes. Greater than 50% of time was spent in counseling and coordination of care.  Follow-Up Instructions: Return in about 6 months (around 11/12/2020) for Osteoarthritis, Fibromyalgia.  Ofilia Neas, PA-C  Note - This record has been created using Dragon software.  Chart creation errors have been sought, but may not always  have been located. Such creation errors do not reflect on  the standard of medical care.

## 2020-05-12 ENCOUNTER — Other Ambulatory Visit: Payer: Self-pay

## 2020-05-12 ENCOUNTER — Ambulatory Visit: Payer: Self-pay

## 2020-05-12 ENCOUNTER — Ambulatory Visit: Payer: BC Managed Care – PPO | Admitting: Physician Assistant

## 2020-05-12 ENCOUNTER — Encounter: Payer: Self-pay | Admitting: Physician Assistant

## 2020-05-12 VITALS — BP 141/88 | HR 72 | Resp 17 | Ht 65.0 in | Wt 214.2 lb

## 2020-05-12 DIAGNOSIS — G8929 Other chronic pain: Secondary | ICD-10-CM

## 2020-05-12 DIAGNOSIS — R768 Other specified abnormal immunological findings in serum: Secondary | ICD-10-CM

## 2020-05-12 DIAGNOSIS — M797 Fibromyalgia: Secondary | ICD-10-CM

## 2020-05-12 DIAGNOSIS — M533 Sacrococcygeal disorders, not elsewhere classified: Secondary | ICD-10-CM | POA: Diagnosis not present

## 2020-05-12 DIAGNOSIS — M7712 Lateral epicondylitis, left elbow: Secondary | ICD-10-CM

## 2020-05-12 DIAGNOSIS — M1711 Unilateral primary osteoarthritis, right knee: Secondary | ICD-10-CM

## 2020-05-12 DIAGNOSIS — I10 Essential (primary) hypertension: Secondary | ICD-10-CM

## 2020-05-12 DIAGNOSIS — M25512 Pain in left shoulder: Secondary | ICD-10-CM

## 2020-05-12 DIAGNOSIS — M7061 Trochanteric bursitis, right hip: Secondary | ICD-10-CM

## 2020-05-12 DIAGNOSIS — M722 Plantar fascial fibromatosis: Secondary | ICD-10-CM

## 2020-05-12 DIAGNOSIS — M7711 Lateral epicondylitis, right elbow: Secondary | ICD-10-CM

## 2020-05-12 DIAGNOSIS — M7062 Trochanteric bursitis, left hip: Secondary | ICD-10-CM

## 2020-05-12 DIAGNOSIS — M5442 Lumbago with sciatica, left side: Secondary | ICD-10-CM | POA: Diagnosis not present

## 2020-05-12 DIAGNOSIS — K219 Gastro-esophageal reflux disease without esophagitis: Secondary | ICD-10-CM

## 2020-05-12 DIAGNOSIS — F5101 Primary insomnia: Secondary | ICD-10-CM

## 2020-05-12 DIAGNOSIS — R5383 Other fatigue: Secondary | ICD-10-CM

## 2020-05-12 MED ORDER — TRIAMCINOLONE ACETONIDE 40 MG/ML IJ SUSP
40.0000 mg | INTRAMUSCULAR | Status: AC | PRN
  Administered 2020-05-12: 40 mg via INTRA_ARTICULAR

## 2020-05-12 MED ORDER — LIDOCAINE HCL 1 % IJ SOLN
1.0000 mL | INTRAMUSCULAR | Status: AC | PRN
  Administered 2020-05-12: 1 mL

## 2020-05-12 NOTE — Patient Instructions (Signed)
Shoulder Exercises Ask your health care provider which exercises are safe for you. Do exercises exactly as told by your health care provider and adjust them as directed. It is normal to feel mild stretching, pulling, tightness, or discomfort as you do these exercises. Stop right away if you feel sudden pain or your pain gets worse. Do not begin these exercises until told by your health care provider. Stretching exercises External rotation and abduction This exercise is sometimes called corner stretch. This exercise rotates your arm outward (external rotation) and moves your arm out from your body (abduction). 1. Stand in a doorway with one of your feet slightly in front of the other. This is called a staggered stance. If you cannot reach your forearms to the door frame, stand facing a corner of a room. 2. Choose one of the following positions as told by your health care provider: ? Place your hands and forearms on the door frame above your head. ? Place your hands and forearms on the door frame at the height of your head. ? Place your hands on the door frame at the height of your elbows. 3. Slowly move your weight onto your front foot until you feel a stretch across your chest and in the front of your shoulders. Keep your head and chest upright and keep your abdominal muscles tight. 4. Hold for __________ seconds. 5. To release the stretch, shift your weight to your back foot. Repeat __________ times. Complete this exercise __________ times a day. Extension, standing 1. Stand and hold a broomstick, a cane, or a similar object behind your back. ? Your hands should be a little wider than shoulder width apart. ? Your palms should face away from your back. 2. Keeping your elbows straight and your shoulder muscles relaxed, move the stick away from your body until you feel a stretch in your shoulders (extension). ? Avoid shrugging your shoulders while you move the stick. Keep your shoulder blades tucked  down toward the middle of your back. 3. Hold for __________ seconds. 4. Slowly return to the starting position. Repeat __________ times. Complete this exercise __________ times a day. Range-of-motion exercises Pendulum  1. Stand near a wall or a surface that you can hold onto for balance. 2. Bend at the waist and let your left / right arm hang straight down. Use your other arm to support you. Keep your back straight and do not lock your knees. 3. Relax your left / right arm and shoulder muscles, and move your hips and your trunk so your left / right arm swings freely. Your arm should swing because of the motion of your body, not because you are using your arm or shoulder muscles. 4. Keep moving your hips and trunk so your arm swings in the following directions, as told by your health care provider: ? Side to side. ? Forward and backward. ? In clockwise and counterclockwise circles. 5. Continue each motion for __________ seconds, or for as long as told by your health care provider. 6. Slowly return to the starting position. Repeat __________ times. Complete this exercise __________ times a day. Shoulder flexion, standing  1. Stand and hold a broomstick, a cane, or a similar object. Place your hands a little more than shoulder width apart on the object. Your left / right hand should be palm up, and your other hand should be palm down. 2. Keep your elbow straight and your shoulder muscles relaxed. Push the stick up with your healthy arm to   raise your left / right arm in front of your body, and then over your head until you feel a stretch in your shoulder (flexion). ? Avoid shrugging your shoulder while you raise your arm. Keep your shoulder blade tucked down toward the middle of your back. 3. Hold for __________ seconds. 4. Slowly return to the starting position. Repeat __________ times. Complete this exercise __________ times a day. Shoulder abduction, standing 1. Stand and hold a broomstick,  a cane, or a similar object. Place your hands a little more than shoulder width apart on the object. Your left / right hand should be palm up, and your other hand should be palm down. 2. Keep your elbow straight and your shoulder muscles relaxed. Push the object across your body toward your left / right side. Raise your left / right arm to the side of your body (abduction) until you feel a stretch in your shoulder. ? Do not raise your arm above shoulder height unless your health care provider tells you to do that. ? If directed, raise your arm over your head. ? Avoid shrugging your shoulder while you raise your arm. Keep your shoulder blade tucked down toward the middle of your back. 3. Hold for __________ seconds. 4. Slowly return to the starting position. Repeat __________ times. Complete this exercise __________ times a day. Internal rotation  1. Place your left / right hand behind your back, palm up. 2. Use your other hand to dangle an exercise band, a towel, or a similar object over your shoulder. Grasp the band with your left / right hand so you are holding on to both ends. 3. Gently pull up on the band until you feel a stretch in the front of your left / right shoulder. The movement of your arm toward the center of your body is called internal rotation. ? Avoid shrugging your shoulder while you raise your arm. Keep your shoulder blade tucked down toward the middle of your back. 4. Hold for __________ seconds. 5. Release the stretch by letting go of the band and lowering your hands. Repeat __________ times. Complete this exercise __________ times a day. Strengthening exercises External rotation  1. Sit in a stable chair without armrests. 2. Secure an exercise band to a stable object at elbow height on your left / right side. 3. Place a soft object, such as a folded towel or a small pillow, between your left / right upper arm and your body to move your elbow about 4 inches (10 cm) away  from your side. 4. Hold the end of the exercise band so it is tight and there is no slack. 5. Keeping your elbow pressed against the soft object, slowly move your forearm out, away from your abdomen (external rotation). Keep your body steady so only your forearm moves. 6. Hold for __________ seconds. 7. Slowly return to the starting position. Repeat __________ times. Complete this exercise __________ times a day. Shoulder abduction  1. Sit in a stable chair without armrests, or stand up. 2. Hold a __________ weight in your left / right hand, or hold an exercise band with both hands. 3. Start with your arms straight down and your left / right palm facing in, toward your body. 4. Slowly lift your left / right hand out to your side (abduction). Do not lift your hand above shoulder height unless your health care provider tells you that this is safe. ? Keep your arms straight. ? Avoid shrugging your shoulder while you   do this movement. Keep your shoulder blade tucked down toward the middle of your back. 5. Hold for __________ seconds. 6. Slowly lower your arm, and return to the starting position. Repeat __________ times. Complete this exercise __________ times a day. Shoulder extension 1. Sit in a stable chair without armrests, or stand up. 2. Secure an exercise band to a stable object in front of you so it is at shoulder height. 3. Hold one end of the exercise band in each hand. Your palms should face each other. 4. Straighten your elbows and lift your hands up to shoulder height. 5. Step back, away from the secured end of the exercise band, until the band is tight and there is no slack. 6. Squeeze your shoulder blades together as you pull your hands down to the sides of your thighs (extension). Stop when your hands are straight down by your sides. Do not let your hands go behind your body. 7. Hold for __________ seconds. 8. Slowly return to the starting position. Repeat __________ times.  Complete this exercise __________ times a day. Shoulder row 1. Sit in a stable chair without armrests, or stand up. 2. Secure an exercise band to a stable object in front of you so it is at waist height. 3. Hold one end of the exercise band in each hand. Position your palms so that your thumbs are facing the ceiling (neutral position). 4. Bend each of your elbows to a 90-degree angle (right angle) and keep your upper arms at your sides. 5. Step back until the band is tight and there is no slack. 6. Slowly pull your elbows back behind you. 7. Hold for __________ seconds. 8. Slowly return to the starting position. Repeat __________ times. Complete this exercise __________ times a day. Shoulder press-ups  1. Sit in a stable chair that has armrests. Sit upright, with your feet flat on the floor. 2. Put your hands on the armrests so your elbows are bent and your fingers are pointing forward. Your hands should be about even with the sides of your body. 3. Push down on the armrests and use your arms to lift yourself off the chair. Straighten your elbows and lift yourself up as much as you comfortably can. ? Move your shoulder blades down, and avoid letting your shoulders move up toward your ears. ? Keep your feet on the ground. As you get stronger, your feet should support less of your body weight as you lift yourself up. 4. Hold for __________ seconds. 5. Slowly lower yourself back into the chair. Repeat __________ times. Complete this exercise __________ times a day. Wall push-ups  1. Stand so you are facing a stable wall. Your feet should be about one arm-length away from the wall. 2. Lean forward and place your palms on the wall at shoulder height. 3. Keep your feet flat on the floor as you bend your elbows and lean forward toward the wall. 4. Hold for __________ seconds. 5. Straighten your elbows to push yourself back to the starting position. Repeat __________ times. Complete this exercise  __________ times a day. This information is not intended to replace advice given to you by your health care provider. Make sure you discuss any questions you have with your health care provider. Document Revised: 02/01/2019 Document Reviewed: 11/09/2018 Elsevier Patient Education  [place].   Back Exercises These exercises help to make your trunk and back strong. They also help to keep the lower back flexible. Doing these exercises can  help to prevent back pain or lessen existing pain.  If you have back pain, try to do these exercises 2-3 times each day or as told by your doctor.  As you get better, do the exercises once each day. Repeat the exercises more often as told by your doctor.  To stop back pain from coming back, do the exercises once each day, or as told by your doctor. Exercises Single knee to chest Do these steps 3-5 times in a row for each leg: 5. Lie on your back on a firm bed or the floor with your legs stretched out. 6. Bring one knee to your chest. 7. Grab your knee or thigh with both hands and hold them it in place. 8. Pull on your knee until you feel a gentle stretch in your lower back or buttocks. 9. Keep doing the stretch for 10-30 seconds. 10. Slowly let go of your leg and straighten it. Pelvic tilt Do these steps 5-10 times in a row: 7. Lie on your back on a firm bed or the floor with your legs stretched out. 8. Bend your knees so they point up to the ceiling. Your feet should be flat on the floor. 9. Tighten your lower belly (abdomen) muscles to press your lower back against the floor. This will make your tailbone point up to the ceiling instead of pointing down to your feet or the floor. 10. Stay in this position for 5-10 seconds while you gently tighten your muscles and breathe evenly. Cat-cow Do these steps until your lower back bends more easily: 5. Get on your hands and knees on a firm surface. Keep your hands under your shoulders, and keep your  knees under your hips. You may put padding under your knees. 6. Let your head hang down toward your chest. Tighten (contract) the muscles in your belly. Point your tailbone toward the floor so your lower back becomes rounded like the back of a cat. 7. Stay in this position for 5 seconds. 8. Slowly lift your head. Let the muscles of your belly relax. Point your tailbone up toward the ceiling so your back forms a sagging arch like the back of a cow. 9. Stay in this position for 5 seconds.  Press-ups Do these steps 5-10 times in a row: 5. Lie on your belly (face-down) on the floor. 6. Place your hands near your head, about shoulder-width apart. 7. While you keep your back relaxed and keep your hips on the floor, slowly straighten your arms to raise the top half of your body and lift your shoulders. Do not use your back muscles. You may change where you place your hands in order to make yourself more comfortable. 8. Stay in this position for 5 seconds. 9. Slowly return to lying flat on the floor.  Bridges Do these steps 10 times in a row: 6. Lie on your back on a firm surface. 7. Bend your knees so they point up to the ceiling. Your feet should be flat on the floor. Your arms should be flat at your sides, next to your body. 8. Tighten your butt muscles and lift your butt off the floor until your waist is almost as high as your knees. If you do not feel the muscles working in your butt and the back of your thighs, slide your feet 1-2 inches farther away from your butt. 9. Stay in this position for 3-5 seconds. 10. Slowly lower your butt to the floor, and let your butt  muscles relax. If this exercise is too easy, try doing it with your arms crossed over your chest. Belly crunches Do these steps 5-10 times in a row: 8. Lie on your back on a firm bed or the floor with your legs stretched out. 9. Bend your knees so they point up to the ceiling. Your feet should be flat on the floor. 10. Cross your  arms over your chest. 11. Tip your chin a little bit toward your chest but do not bend your neck. 12. Tighten your belly muscles and slowly raise your chest just enough to lift your shoulder blades a tiny bit off of the floor. Avoid raising your body higher than that, because it can put too much stress on your low back. 13. Slowly lower your chest and your head to the floor. Back lifts Do these steps 5-10 times in a row: 7. Lie on your belly (face-down) with your arms at your sides, and rest your forehead on the floor. 8. Tighten the muscles in your legs and your butt. 9. Slowly lift your chest off of the floor while you keep your hips on the floor. Keep the back of your head in line with the curve in your back. Look at the floor while you do this. 10. Stay in this position for 3-5 seconds. 11. Slowly lower your chest and your face to the floor. Contact a doctor if:  Your back pain gets a lot worse when you do an exercise.  Your back pain does not get better 2 hours after you exercise. If you have any of these problems, stop doing the exercises. Do not do them again unless your doctor says it is okay. Get help right away if:  You have sudden, very bad back pain. If this happens, stop doing the exercises. Do not do them again unless your doctor says it is okay. This information is not intended to replace advice given to you by your health care provider. Make sure you discuss any questions you have with your health care provider. Document Revised: 07/05/2018 Document Reviewed: 07/05/2018 Elsevier Patient Education  2020 [employer].

## 2020-09-14 ENCOUNTER — Encounter (INDEPENDENT_AMBULATORY_CARE_PROVIDER_SITE_OTHER): Payer: Self-pay | Admitting: *Deleted

## 2020-10-27 NOTE — Progress Notes (Deleted)
   Office Visit Note  Patient: Katie Branch             Date of Birth: 04/16/1971           MRN: [id number]             PCP: Kirstie Peri, MD Referring: Kirstie Peri, MD Visit Date: 11/10/2020 Occupation: @GUAROCC @  Subjective:  No chief complaint on file.   History of Present Illness: Katie Branch is a 50 y.o. female ***   Activities of Daily Living:  Patient reports morning stiffness for *** {minute/hour:19697}.   Patient {ACTIONS;DENIES/REPORTS:21021675::"Denies"} nocturnal pain.  Difficulty dressing/grooming: {ACTIONS;DENIES/REPORTS:21021675::"Denies"} Difficulty climbing stairs: {ACTIONS;DENIES/REPORTS:21021675::"Denies"} Difficulty getting out of chair: {ACTIONS;DENIES/REPORTS:21021675::"Denies"} Difficulty using hands for taps, buttons, cutlery, and/or writing: {ACTIONS;DENIES/REPORTS:21021675::"Denies"}  No Rheumatology ROS completed.   PMFS History:  Patient Active Problem List   Diagnosis Date Noted  . Essential hypertension 01/04/2017  . Gastroesophageal reflux disease without esophagitis 01/04/2017  . Fibromyalgia 01/03/2017  . Other fatigue 01/03/2017  . Primary insomnia 01/03/2017  . Plantar fasciitis 01/03/2017    Past Medical History:  Diagnosis Date  . Fatigue   . Fibromyalgia   . GERD (gastroesophageal reflux disease)   . Hypertension   . Plantar fasciitis     Family History  Problem Relation Age of Onset  . Cancer Mother        pancreatic   . Heart disease Father   . COPD Father    Past Surgical History:  Procedure Laterality Date  . FOOT SURGERY Left 11/25/2019   Social History   Social History Narrative  . Not on file   Immunization History  Administered Date(s) Administered  . Influenza Inj Mdck Quad Pf 08/20/2019  . Influenza-Unspecified 08/28/2019  . Moderna Sars-Covid-2 Vaccination 01/23/2020, 02/25/2020     Objective: Vital Signs: There were no vitals taken for this visit.   Physical Exam   Musculoskeletal Exam:  ***  CDAI Exam: CDAI Score: -- Patient Global: --; Provider Global: -- Swollen: --; Tender: -- Joint Exam 11/10/2020   No joint exam has been documented for this visit   There is currently no information documented on the homunculus. Go to the Rheumatology activity and complete the homunculus joint exam.  Investigation: No additional findings.  Imaging: No results found.  Recent Labs: No results found for: WBC, HGB, PLT, NA, K, CL, CO2, GLUCOSE, BUN, CREATININE, BILITOT, ALKPHOS, AST, ALT, PROT, ALBUMIN, CALCIUM, GFRAA, QFTBGOLD, QFTBGOLDPLUS  Speciality Comments: No specialty comments available.  Procedures:  No procedures performed Allergies: Penicillins   Assessment / Plan:     Visit Diagnoses: No diagnosis found.  Orders: No orders of the defined types were placed in this encounter.  No orders of the defined types were placed in this encounter.   Face-to-face time spent with patient was *** minutes. Greater than 50% of time was spent in counseling and coordination of care.  Follow-Up Instructions: No follow-ups on file.   11/12/2020, CMA  Note - This record has been created using Ellen Henri.  Chart creation errors have been sought, but may not always  have been located. Such creation errors do not reflect on  the standard of medical care.

## 2020-11-10 ENCOUNTER — Ambulatory Visit: Payer: BC Managed Care – PPO | Admitting: Physician Assistant

## 2020-11-10 DIAGNOSIS — M7061 Trochanteric bursitis, right hip: Secondary | ICD-10-CM

## 2020-11-10 DIAGNOSIS — K219 Gastro-esophageal reflux disease without esophagitis: Secondary | ICD-10-CM

## 2020-11-10 DIAGNOSIS — M7711 Lateral epicondylitis, right elbow: Secondary | ICD-10-CM

## 2020-11-10 DIAGNOSIS — M1711 Unilateral primary osteoarthritis, right knee: Secondary | ICD-10-CM

## 2020-11-10 DIAGNOSIS — I10 Essential (primary) hypertension: Secondary | ICD-10-CM

## 2020-11-10 DIAGNOSIS — M797 Fibromyalgia: Secondary | ICD-10-CM

## 2020-11-10 DIAGNOSIS — R5383 Other fatigue: Secondary | ICD-10-CM

## 2020-11-10 DIAGNOSIS — M722 Plantar fascial fibromatosis: Secondary | ICD-10-CM

## 2020-11-10 DIAGNOSIS — F5101 Primary insomnia: Secondary | ICD-10-CM

## 2020-11-10 DIAGNOSIS — R768 Other specified abnormal immunological findings in serum: Secondary | ICD-10-CM

## 2020-11-10 DIAGNOSIS — G8929 Other chronic pain: Secondary | ICD-10-CM

## 2020-12-01 ENCOUNTER — Other Ambulatory Visit: Payer: Self-pay

## 2020-12-02 MED ORDER — BACLOFEN 10 MG PO TABS: 10.0000 mg | ORAL_TABLET | Freq: Two times a day (BID) | ORAL | 0 refills | Status: AC | PRN

## 2020-12-02 NOTE — Telephone Encounter (Signed)
Last Visit: 05/12/2020 Next Visit: 01/01/2021  Current Dose per office note on 05/12/2020, baclofen 10 mg twice daily as needed for muscle spasms [id number]   Last Fill: 10/12/2018  Okay to refill Baclofen?

## 2020-12-13 ENCOUNTER — Other Ambulatory Visit: Payer: Self-pay | Admitting: Rheumatology

## 2020-12-18 ENCOUNTER — Other Ambulatory Visit: Payer: Self-pay

## 2020-12-18 MED ORDER — DULOXETINE HCL 60 MG PO CPEP: 60.0000 mg | ORAL_CAPSULE | Freq: Every day | ORAL | 2 refills | Status: DC

## 2020-12-18 NOTE — Telephone Encounter (Signed)
Last Visit: 05/12/2020 Next Visit: 01/01/2021  Current Dose per office note on 05/12/2020: Cymbalta 60 mg 1 capsule by mouth daily  Dx: Fibromyalgia  Last Fill: 10/15/2018  Okay to refill Cymbalta?

## 2020-12-18 NOTE — Telephone Encounter (Signed)
Patient called requesting prescription refill of Cymbalta to be sent to [facility] in [place].

## 2020-12-23 ENCOUNTER — Encounter (INDEPENDENT_AMBULATORY_CARE_PROVIDER_SITE_OTHER): Payer: Self-pay | Admitting: *Deleted

## 2020-12-25 NOTE — Progress Notes (Deleted)
   Office Visit Note  Patient: Katie Branch             Date of Birth: January 13, 1971           MRN: [id number]             PCP: Monico Blitz, MD Referring: Monico Blitz, MD Visit Date: 01/08/2021 Occupation: @GUAROCC @  Subjective:  No chief complaint on file.   History of Present Illness: Katie Branch is a 50 y.o. female ***   Activities of Daily Living:  Patient reports morning stiffness for *** {minute/hour:19697}.   Patient {ACTIONS;DENIES/REPORTS:21021675::"Denies"} nocturnal pain.  Difficulty dressing/grooming: {ACTIONS;DENIES/REPORTS:21021675::"Denies"} Difficulty climbing stairs: {ACTIONS;DENIES/REPORTS:21021675::"Denies"} Difficulty getting out of chair: {ACTIONS;DENIES/REPORTS:21021675::"Denies"} Difficulty using hands for taps, buttons, cutlery, and/or writing: {ACTIONS;DENIES/REPORTS:21021675::"Denies"}  No Rheumatology ROS completed.   PMFS History:  Patient Active Problem List   Diagnosis Date Noted  . Essential hypertension 01/04/2017  . Gastroesophageal reflux disease without esophagitis 01/04/2017  . Fibromyalgia 01/03/2017  . Other fatigue 01/03/2017  . Primary insomnia 01/03/2017  . Plantar fasciitis 01/03/2017    Past Medical History:  Diagnosis Date  . Fatigue   . Fibromyalgia   . GERD (gastroesophageal reflux disease)   . Hypertension   . Plantar fasciitis     Family History  Problem Relation Age of Onset  . Cancer Mother        pancreatic   . Heart disease Father   . COPD Father    Past Surgical History:  Procedure Laterality Date  . FOOT SURGERY Left 11/25/2019   Social History   Social History Narrative  . Not on file   Immunization History  Administered Date(s) Administered  . Influenza Inj Mdck Quad Pf 08/20/2019  . Influenza-Unspecified 08/28/2019  . Moderna Sars-Covid-2 Vaccination 01/23/2020, 02/25/2020     Objective: Vital Signs: There were no vitals taken for this visit.   Physical Exam   Musculoskeletal Exam:  ***  CDAI Exam: CDAI Score: -- Patient Global: --; Provider Global: -- Swollen: --; Tender: -- Joint Exam 01/08/2021   No joint exam has been documented for this visit   There is currently no information documented on the homunculus. Go to the Rheumatology activity and complete the homunculus joint exam.  Investigation: No additional findings.  Imaging: No results found.  Recent Labs: No results found for: WBC, HGB, PLT, NA, K, CL, CO2, GLUCOSE, BUN, CREATININE, BILITOT, ALKPHOS, AST, ALT, PROT, ALBUMIN, CALCIUM, GFRAA, QFTBGOLD, QFTBGOLDPLUS  Speciality Comments: No specialty comments available.  Procedures:  No procedures performed Allergies: Penicillins   Assessment / Plan:     Visit Diagnoses: No diagnosis found.  Orders: No orders of the defined types were placed in this encounter.  No orders of the defined types were placed in this encounter.   Face-to-face time spent with patient was *** minutes. Greater than 50% of time was spent in counseling and coordination of care.  Follow-Up Instructions: No follow-ups on file.   Earnestine Mealing, CMA  Note - This record has been created using Editor, commissioning.  Chart creation errors have been sought, but may not always  have been located. Such creation errors do not reflect on  the standard of medical care.

## 2020-12-30 ENCOUNTER — Encounter (INDEPENDENT_AMBULATORY_CARE_PROVIDER_SITE_OTHER): Payer: Self-pay

## 2020-12-30 ENCOUNTER — Telehealth (INDEPENDENT_AMBULATORY_CARE_PROVIDER_SITE_OTHER): Payer: Self-pay

## 2020-12-30 ENCOUNTER — Other Ambulatory Visit (INDEPENDENT_AMBULATORY_CARE_PROVIDER_SITE_OTHER): Payer: Self-pay

## 2020-12-30 DIAGNOSIS — Z1211 Encounter for screening for malignant neoplasm of colon: Secondary | ICD-10-CM

## 2020-12-30 MED ORDER — PEG 3350-KCL-NA BICARB-NACL 420 G PO SOLR: 4000.0000 mL | Freq: Once | ORAL | 0 refills | Status: AC

## 2020-12-30 NOTE — Telephone Encounter (Signed)
Katie Branch, [place]

## 2020-12-30 NOTE — Telephone Encounter (Signed)
Ok to schedule.  Thanks,  Allyanna Appleman Castaneda Mayorga, MD Gastroenterology and Hepatology [facility]  

## 2020-12-30 NOTE — Telephone Encounter (Signed)
Referring MD/PCP: Bernardo Heater   Procedure: tcs  Reason/Indication:  screening  Has patient had this procedure before?  No  If so, when, by whom and where?    Is there a family history of colon cancer?  No  Who?  What age when diagnosed?    Is patient diabetic?   No      Does patient have prosthetic heart valve or mechanical valve?  No  Do you have a pacemaker/defibrillator?  No  Has patient ever had endocarditis/atrial fibrillation? No  Have you had a stroke/heart attack last 6 mths? No  Does patient use oxygen? No  Has patient had joint replacement within last 12 months?  No  Is patient constipated or do they take laxatives? Yes  Does patient have a history of alcohol/drug use?  No  Is patient on blood thinner such as Coumadin, Plavix and/or Aspirin? No  Do you take medicine for weight loss. No  Medications: Topamax 25 mg daily, Cymbalta 60 mg dailiy, Crestor 5 mg daily, Lisinopril 20 mg daily  Allergies: PCN  Procedure date & time: 01/06/2021 AM

## 2020-12-31 ENCOUNTER — Encounter (INDEPENDENT_AMBULATORY_CARE_PROVIDER_SITE_OTHER): Payer: Self-pay

## 2021-01-01 ENCOUNTER — Ambulatory Visit: Payer: Self-pay | Admitting: Physician Assistant

## 2021-01-04 ENCOUNTER — Other Ambulatory Visit (HOSPITAL_COMMUNITY)
Admission: RE | Admit: 2021-01-04 | Discharge: 2021-01-04 | Disposition: A | Discharge status: Home / Self Care | Payer: 59 | Source: Ambulatory Visit | Attending: Gastroenterology | Admitting: Gastroenterology

## 2021-01-04 ENCOUNTER — Encounter (HOSPITAL_COMMUNITY): Payer: Self-pay | Admitting: Gastroenterology

## 2021-01-04 ENCOUNTER — Telehealth (INDEPENDENT_AMBULATORY_CARE_PROVIDER_SITE_OTHER): Payer: Self-pay

## 2021-01-04 ENCOUNTER — Other Ambulatory Visit: Payer: Self-pay

## 2021-01-04 DIAGNOSIS — Z20822 Contact with and (suspected) exposure to covid-19: Secondary | ICD-10-CM | POA: Insufficient documentation

## 2021-01-04 DIAGNOSIS — Z01812 Encounter for preprocedural laboratory examination: Secondary | ICD-10-CM | POA: Insufficient documentation

## 2021-01-04 DIAGNOSIS — Z1211 Encounter for screening for malignant neoplasm of colon: Secondary | ICD-10-CM

## 2021-01-04 MED ORDER — PEG 3350-KCL-NA BICARB-NACL 420 G PO SOLR: 4000.0000 mL | ORAL | 0 refills | Status: DC

## 2021-01-04 NOTE — Telephone Encounter (Signed)
LeighAnn Herson Prichard, CMA  

## 2021-01-05 LAB — SARS CORONAVIRUS 2 (TAT 6-24 HRS): SARS Coronavirus 2: NEGATIVE

## 2021-01-06 ENCOUNTER — Ambulatory Visit (HOSPITAL_COMMUNITY): Payer: 59 | Admitting: Anesthesiology

## 2021-01-06 ENCOUNTER — Encounter (HOSPITAL_COMMUNITY)
Admission: RE | Disposition: A | Discharge status: Home / Self Care | Payer: Self-pay | Source: Home / Self Care | Attending: Gastroenterology

## 2021-01-06 ENCOUNTER — Ambulatory Visit (HOSPITAL_COMMUNITY)
Admission: RE | Admit: 2021-01-06 | Discharge: 2021-01-06 | Disposition: A | Discharge status: Home / Self Care | Payer: 59 | Attending: Gastroenterology | Admitting: Gastroenterology

## 2021-01-06 ENCOUNTER — Other Ambulatory Visit (INDEPENDENT_AMBULATORY_CARE_PROVIDER_SITE_OTHER): Payer: Self-pay

## 2021-01-06 ENCOUNTER — Telehealth (INDEPENDENT_AMBULATORY_CARE_PROVIDER_SITE_OTHER): Payer: Self-pay

## 2021-01-06 ENCOUNTER — Other Ambulatory Visit: Payer: Self-pay

## 2021-01-06 ENCOUNTER — Encounter (HOSPITAL_COMMUNITY): Payer: Self-pay | Admitting: Gastroenterology

## 2021-01-06 DIAGNOSIS — I10 Essential (primary) hypertension: Secondary | ICD-10-CM | POA: Insufficient documentation

## 2021-01-06 DIAGNOSIS — Z825 Family history of asthma and other chronic lower respiratory diseases: Secondary | ICD-10-CM | POA: Insufficient documentation

## 2021-01-06 DIAGNOSIS — M797 Fibromyalgia: Secondary | ICD-10-CM | POA: Insufficient documentation

## 2021-01-06 DIAGNOSIS — Z1211 Encounter for screening for malignant neoplasm of colon: Secondary | ICD-10-CM | POA: Diagnosis not present

## 2021-01-06 DIAGNOSIS — Z8249 Family history of ischemic heart disease and other diseases of the circulatory system: Secondary | ICD-10-CM | POA: Insufficient documentation

## 2021-01-06 DIAGNOSIS — Z880 Allergy status to penicillin: Secondary | ICD-10-CM | POA: Diagnosis not present

## 2021-01-06 DIAGNOSIS — K219 Gastro-esophageal reflux disease without esophagitis: Secondary | ICD-10-CM | POA: Insufficient documentation

## 2021-01-06 DIAGNOSIS — Z9119 Patient's noncompliance with other medical treatment and regimen: Secondary | ICD-10-CM

## 2021-01-06 DIAGNOSIS — Z79899 Other long term (current) drug therapy: Secondary | ICD-10-CM | POA: Diagnosis not present

## 2021-01-06 DIAGNOSIS — Z800 Family history of malignant neoplasm of digestive organs: Secondary | ICD-10-CM | POA: Insufficient documentation

## 2021-01-06 HISTORY — PX: FLEXIBLE SIGMOIDOSCOPY: SHX5431

## 2021-01-06 SURGERY — SIGMOIDOSCOPY, FLEXIBLE
Anesthesia: General

## 2021-01-06 MED ORDER — LIDOCAINE HCL (CARDIAC) PF 100 MG/5ML IV SOSY
PREFILLED_SYRINGE | INTRAVENOUS | Status: DC | PRN
  Administered 2021-01-06: 40 mg via INTRAVENOUS

## 2021-01-06 MED ORDER — PEG 3350-KCL-NA BICARB-NACL 420 G PO SOLR: 4000.0000 mL | ORAL | 0 refills | Status: DC

## 2021-01-06 MED ORDER — LACTATED RINGERS IV SOLN: INTRAVENOUS | Status: DC

## 2021-01-06 MED ORDER — PROPOFOL 10 MG/ML IV BOLUS
INTRAVENOUS | Status: DC | PRN
  Administered 2021-01-06: 100 mg via INTRAVENOUS

## 2021-01-06 MED ORDER — STERILE WATER FOR IRRIGATION IR SOLN
Status: DC | PRN
  Administered 2021-01-06: 200 mL

## 2021-01-06 MED ORDER — CHLORHEXIDINE GLUCONATE CLOTH 2 % EX PADS: 6.0000 | MEDICATED_PAD | Freq: Once | CUTANEOUS | Status: DC

## 2021-01-06 MED ORDER — PROPOFOL 500 MG/50ML IV EMUL
INTRAVENOUS | Status: DC | PRN
  Administered 2021-01-06: 150 ug/kg/min via INTRAVENOUS

## 2021-01-06 NOTE — Op Note (Signed)
[facility] Patient Name: Katie Branch Procedure Date: 01/06/2021 11:16 AM MRN: [id number] Date of Birth: 1971-08-12 Attending MD: Maylon Peppers ,  CSN: [id number] Age: 50 Admit Type: Outpatient Procedure:                Flexible Sigmoidoscopy Indications:              Screening for malignant neoplasm in the colon Providers:                Maylon Peppers, [place] Page, [place] Risa Grill, Technician Referring MD:              Medicines:                Monitored Anesthesia Care Complications:            No immediate complications. Estimated Blood Loss:     Estimated blood loss: none. Procedure:                Pre-Anesthesia Assessment:                           - Prior to the procedure, a History and Physical                            was performed, and patient medications, allergies                            and sensitivities were reviewed. The patient's                            tolerance of previous anesthesia was reviewed.                           - The risks and benefits of the procedure and the                            sedation options and risks were discussed with the                            patient. All questions were answered and informed                            consent was obtained.                           - ASA Grade Assessment: I - A normal, healthy                            patient.                           The PCF-[id number] ([id number]) scope was introduced                            through the anus and advanced to the the descending  colon. The flexible sigmoidoscopy was performed                            with difficulty due to poor bowel prep. The quality                            of the bowel preparation was inadequate. Scope In: 11:34:52 AM Scope Out: 11:38:10 AM Total Procedure Duration: 0 hours 3 minutes 18 seconds  Findings:      The perianal and digital rectal examinations were  normal.      A large amount of semi-solid stool was found in the descending colon,       precluding visualization. Impression:               - Preparation of the colon was inadequate.                           - Stool in the descending colon.                           - No specimens collected. Moderate Sedation:      Per Anesthesia Care Recommendation:           - Discharge patient to home (ambulatory).                           - Repeat colonoscopy at the next available                            appointment because the bowel preparation was poor. Procedure Code(s):        --- Professional ---                           P7106, Colorectal cancer screening; flexible                            sigmoidoscopy Diagnosis Code(s):        --- Professional ---                           Z12.11, Encounter for screening for malignant                            neoplasm of colon CPT copyright 2019 American Medical Association. All rights reserved. The codes documented in this report are preliminary and upon coder review may  be revised to meet current compliance requirements. Maylon Peppers, MD Maylon Peppers,  01/06/2021 11:44:48 AM This report has been signed electronically. Number of Addenda: 0

## 2021-01-06 NOTE — Discharge Instructions (Signed)
You are being discharged to home.  Take a clear liquid diet for the next two days if willing to do repeat colonoscopy.  Your physician has recommended a repeat colonoscopy at the next available appointment because the bowel preparation was poor.    Colonoscopy, Adult, Care After This sheet gives you information about how to care for yourself after your procedure. Your doctor may also give you more specific instructions. If you have problems or questions, call your doctor. What can I expect after the procedure? After the procedure, it is common to have:  A small amount of blood in your poop (stool) for 24 hours.  Some gas.  Mild cramping or bloating in your belly (abdomen). Follow these instructions at home: Eating and drinking  Drink enough fluid to keep your pee (urine) pale yellow.  Follow instructions from your doctor about what you cannot eat or drink.  Return to your normal diet as told by your doctor. Avoid heavy or fried foods that are hard to digest.   Activity  Rest as told by your doctor.  Do not sit for a long time without moving. Get up to take short walks every 1-2 hours. This is important. Ask for help if you feel weak or unsteady.  Return to your normal activities as told by your doctor. Ask your doctor what activities are safe for you. To help cramping and bloating:  Try walking around.  Put heat on your belly as told by your doctor. Use the heat source that your doctor recommends, such as a moist heat pack or a heating pad. ? Put a towel between your skin and the heat source. ? Leave the heat on for 20-30 minutes. ? Remove the heat if your skin turns bright red. This is very important if you are unable to feel pain, heat, or cold. You may have a greater risk of getting burned.   General instructions  If you were given a medicine to help you relax (sedative) during your procedure, it can affect you for many hours. Do not drive or use machinery until your doctor  says that it is safe.  For the first 24 hours after the procedure: ? Do not sign important documents. ? Do not drink alcohol. ? Do your daily activities more slowly than normal. ? Eat foods that are soft and easy to digest.  Take over-the-counter or prescription medicines only as told by your doctor.  Keep all follow-up visits as told by your doctor. This is important. Contact a doctor if:  You have blood in your poop 2-3 days after the procedure. Get help right away if:  You have more than a small amount of blood in your poop.  You see large clumps of tissue (blood clots) in your poop.  Your belly is swollen.  You feel like you may vomit (nauseous).  You vomit.  You have a fever.  You have belly pain that gets worse, and medicine does not help your pain. Summary  After the procedure, it is common to have a small amount of blood in your poop. You may also have mild cramping and bloating in your belly.  If you were given a medicine to help you relax (sedative) during your procedure, it can affect you for many hours. Do not drive or use machinery until your doctor says that it is safe.  Get help right away if you have a lot of blood in your poop, feel like you may vomit, have a fever,  or have more belly pain. This information is not intended to replace advice given to you by your health care provider. Make sure you discuss any questions you have with your health care provider. Document Revised: 08/16/2019 Document Reviewed: 05/06/2019 Elsevier Patient Education  [place].

## 2021-01-06 NOTE — Anesthesia Preprocedure Evaluation (Addendum)
Anesthesia Evaluation  Patient identified by MRN, date of birth, ID band Patient awake    Reviewed: Allergy & Precautions, NPO status , Patient's Chart, lab work & pertinent test results  Airway Mallampati: II  TM Distance: >3 FB Neck ROM: Full    Dental  (+) Dental Advisory Given, Teeth Intact   Pulmonary    Pulmonary exam normal breath sounds clear to auscultation       Cardiovascular Exercise Tolerance: Good hypertension, Pt. on medications Normal cardiovascular exam Rhythm:Regular Rate:Normal     Neuro/Psych  Neuromuscular disease    GI/Hepatic Neg liver ROS, GERD  ,  Endo/Other  negative endocrine ROS  Renal/GU negative Renal ROS     Musculoskeletal  (+) Fibromyalgia -Right pinky fx   Abdominal   Peds  Hematology negative hematology ROS (+)   Anesthesia Other Findings   Reproductive/Obstetrics                            Anesthesia Physical Anesthesia Plan  ASA: II  Anesthesia Plan: General   Post-op Pain Management:    Induction: Intravenous  PONV Risk Score and Plan: TIVA and Propofol infusion  Airway Management Planned: Nasal Cannula and Natural Airway  Additional Equipment:   Intra-op Plan:   Post-operative Plan:   Informed Consent: I have reviewed the patients History and Physical, chart, labs and discussed the procedure including the risks, benefits and alternatives for the proposed anesthesia with the patient or authorized representative who has indicated his/her understanding and acceptance.       Plan Discussed with: CRNA and Surgeon  Anesthesia Plan Comments:         Anesthesia Quick Evaluation

## 2021-01-06 NOTE — Transfer of Care (Signed)
Immediate Anesthesia Transfer of Care Note  Patient: Katie Branch  Procedure(s) Performed: FLEXIBLE SIGMOIDOSCOPY  Patient Location: PACU  Anesthesia Type:General  Level of Consciousness: awake, alert  and oriented  Airway & Oxygen Therapy: Patient Spontanous Breathing  Post-op Assessment: Report given to RN and Post -op Vital signs reviewed and stable  Post vital signs: Reviewed and stable  Last Vitals:  Vitals Value Taken Time  BP    Temp    Pulse    Resp    SpO2      Last Pain:  Vitals:   01/06/21 1129  TempSrc:   PainSc: 0-No pain      Patients Stated Pain Goal: 5 ([id number])  Complications: No complications documented.

## 2021-01-06 NOTE — Telephone Encounter (Signed)
Katie Branch, CMA  

## 2021-01-06 NOTE — H&P (Signed)
Katie Branch is an 50 y.o. female.   Chief Complaint: Colorectal cancer screening HPI: 50 year old female with past medical history of fibromyalgia, GERD, hypertension and plantar fasciitis, coming for screening colonoscopy. The patient has never had a colonoscopy in the past.  The patient denies having any complaints such as melena, abdominal pain or distention, change in her bowel movement consistency or frequency, no changes in her weight recently.  No family history of colorectal cancer.  Patient reported that she had one isolated episode of rectal bleeding close to 6 months ago but has not presented any more of these episodes since then.   Past Medical History:  Diagnosis Date  . Fatigue   . Fibromyalgia   . GERD (gastroesophageal reflux disease)   . Hypertension   . Plantar fasciitis     Past Surgical History:  Procedure Laterality Date  . FOOT SURGERY Left 11/25/2019    Family History  Problem Relation Age of Onset  . Cancer Mother        pancreatic   . Heart disease Father   . COPD Father    Social History:  reports that she has never smoked. She has never used smokeless tobacco. She reports current alcohol use. She reports that she does not use drugs.  Allergies:  Allergies  Allergen Reactions  . Penicillins Rash and Other (See Comments)    High fever    Medications Prior to Admission  Medication Sig Dispense Refill  . baclofen (LIORESAL) 10 MG tablet Take 1 tablet (10 mg total) by mouth 2 (two) times daily as needed for muscle spasms. 60 tablet 0  . DULoxetine (CYMBALTA) 60 MG capsule Take 1 capsule (60 mg total) by mouth daily. (Patient taking differently: Take 60 mg by mouth at bedtime.) 30 capsule 2  . lisinopril (PRINIVIL,ZESTRIL) 20 MG tablet Take 20 mg by mouth at bedtime.  1  . polyethylene glycol-electrolytes (TRILYTE) 420 g solution Take 4,000 mLs by mouth as directed. 4000 mL 0  . rosuvastatin (CRESTOR) 20 MG tablet Take 20 mg by mouth at bedtime.    .  topiramate (TOPAMAX) 25 MG tablet Take 25 mg by mouth at bedtime.      No results found for this or any previous visit (from the past 48 hour(s)). No results found.  Review of Systems  Constitutional: Negative.   HENT: Negative.   Eyes: Negative.   Respiratory: Negative.   Cardiovascular: Negative.   Gastrointestinal: Negative.   Endocrine: Negative.   Genitourinary: Negative.   Musculoskeletal: Negative.   Skin: Negative.   Allergic/Immunologic: Negative.   Neurological: Negative.   Hematological: Negative.   Psychiatric/Behavioral: Negative.     Blood pressure 137/68, pulse 74, temperature 98.5 F (36.9 C), temperature source Oral, resp. rate 20, height 5\' 5"  (1.651 m), weight 97.5 kg, SpO2 100 %. Physical Exam  GENERAL: The patient is AO x3, in no acute distress. HEENT: Head is normocephalic and atraumatic. EOMI are intact. Mouth is well hydrated and without lesions. NECK: Supple. No masses LUNGS: Clear to auscultation. No presence of rhonchi/wheezing/rales. Adequate chest expansion HEART: RRR, normal s1 and s2. ABDOMEN: Soft, nontender, no guarding, no peritoneal signs, and nondistended. BS +. No masses. EXTREMITIES: Without any cyanosis, clubbing, rash, lesions or edema. NEUROLOGIC: AOx3, no focal motor deficit. SKIN: no jaundice, no rashes  Assessment/Plan  50 year old female with past medical history of fibromyalgia, GERD, hypertension and plantar fasciitis, coming for screening colonoscopy. The patient is at average risk for colorectal cancer.  We will  proceed with colonoscopy today.   Harvel Quale, MD 01/06/2021, 11:27 AM

## 2021-01-06 NOTE — Anesthesia Postprocedure Evaluation (Signed)
Anesthesia Post Note  Patient: Katie Branch  Procedure(s) Performed: [place]  Patient location during evaluation: PACU Anesthesia Type: General Level of consciousness: awake and alert and oriented Pain management: pain level controlled Vital Signs Assessment: post-procedure vital signs reviewed and stable Respiratory status: spontaneous breathing and respiratory function stable Cardiovascular status: blood pressure returned to baseline and stable Postop Assessment: no apparent nausea or vomiting Anesthetic complications: no   No complications documented.   Last Vitals:  Vitals:   01/06/21 1033 01/06/21 1143  BP: 137/68 (!) 113/58  Pulse: 74   Resp: 20 17  Temp: 36.9 C 36.5 C  SpO2: 100% 94%    Last Pain:  Vitals:   01/06/21 1143  TempSrc: Oral  PainSc: 0-No pain                 Genessis Flanary C Blease Capaldi

## 2021-01-07 ENCOUNTER — Encounter (HOSPITAL_COMMUNITY): Payer: Self-pay | Admitting: Anesthesiology

## 2021-01-07 ENCOUNTER — Other Ambulatory Visit (INDEPENDENT_AMBULATORY_CARE_PROVIDER_SITE_OTHER): Payer: Self-pay

## 2021-01-07 ENCOUNTER — Encounter (HOSPITAL_COMMUNITY): Payer: Self-pay | Admitting: Gastroenterology

## 2021-01-08 ENCOUNTER — Encounter (HOSPITAL_COMMUNITY): Admission: RE | Payer: Self-pay | Source: Home / Self Care

## 2021-01-08 ENCOUNTER — Ambulatory Visit: Payer: Self-pay | Admitting: Physician Assistant

## 2021-01-08 ENCOUNTER — Ambulatory Visit (HOSPITAL_COMMUNITY): Admission: RE | Admit: 2021-01-08 | Payer: 59 | Source: Home / Self Care | Admitting: Gastroenterology

## 2021-01-08 DIAGNOSIS — M7061 Trochanteric bursitis, right hip: Secondary | ICD-10-CM

## 2021-01-08 DIAGNOSIS — R768 Other specified abnormal immunological findings in serum: Secondary | ICD-10-CM

## 2021-01-08 DIAGNOSIS — K219 Gastro-esophageal reflux disease without esophagitis: Secondary | ICD-10-CM

## 2021-01-08 DIAGNOSIS — F5101 Primary insomnia: Secondary | ICD-10-CM

## 2021-01-08 DIAGNOSIS — M797 Fibromyalgia: Secondary | ICD-10-CM

## 2021-01-08 DIAGNOSIS — R5383 Other fatigue: Secondary | ICD-10-CM

## 2021-01-08 DIAGNOSIS — M25512 Pain in left shoulder: Secondary | ICD-10-CM

## 2021-01-08 DIAGNOSIS — M1711 Unilateral primary osteoarthritis, right knee: Secondary | ICD-10-CM

## 2021-01-08 DIAGNOSIS — G8929 Other chronic pain: Secondary | ICD-10-CM

## 2021-01-08 DIAGNOSIS — M722 Plantar fascial fibromatosis: Secondary | ICD-10-CM

## 2021-01-08 DIAGNOSIS — M7711 Lateral epicondylitis, right elbow: Secondary | ICD-10-CM

## 2021-01-08 DIAGNOSIS — I10 Essential (primary) hypertension: Secondary | ICD-10-CM

## 2021-01-08 SURGERY — COLONOSCOPY WITH PROPOFOL
Anesthesia: Monitor Anesthesia Care

## 2021-01-08 NOTE — Progress Notes (Signed)
Office Visit Note  Patient: Katie Branch             Date of Birth: November 06, 1970           MRN: [id number]             PCP: Monico Blitz, MD Referring: Monico Blitz, MD Visit Date: 01/20/2021 Occupation: @GUAROCC @  Subjective:  Left shoulder joint pain   History of Present Illness: Katie Branch is a 50 y.o. female with history of osteoarthritis and fibromyalgia. She presents today with intermittent left shoulder joint discomfort since July 2021. She did not have any injury prior to the onset of symptoms.  She states over the weekend her discomfort was severe and lasted for 3 days.  She states that during these episodes she experiences numbness down her left arm radiating to her entire left hand. She has also noticed some increased weakness in the left arm during these episodes.  She denies any neck pain or stiffness.  She has ongoing trapezius muscle tension and tenderness bilaterally.  She states several months ago she was seeing a massage therapist to help alleviate her left shoulder joint discomfort and she was told she has a lot of scar tissue in the left shoulder.  She has been applying ice as needed for pain relief, but she states when these episodes occur her pain is a 15/10. She continues to have intermittent myalgias and muscle tenderness due to underlying fibromyalgia.  Her discomfort is typically exacerbated by weather changes.  Her energy level has been stable.  She has been experiencing interrupted sleep at night due to nocturnal pain in her left shoulder which has caused worsening insomnia.  She continues to take Topamax 25 mg 1 tablet by mouth at bedtime.  She is also taking Cymbalta 60 mg 1 capsule by mouth daily and baclofen twice daily as needed for muscle spasms. She states her left SI joint pain has improved since having a cortisone injection on 05/12/20. She has occasional discomfort in her right knee joint but denies any joint swelling.  She reports that she recently had a  work-related injury and fractured her right fifth digit.  She is currently in a cast.  She denies any other joint pain or joint swelling at this time.     Activities of Daily Living:  Patient reports morning stiffness for 1 hour.   Patient Reports nocturnal pain.  Difficulty dressing/grooming: Denies Difficulty climbing stairs: Denies Difficulty getting out of chair: Denies Difficulty using hands for taps, buttons, cutlery, and/or writing: Denies  Review of Systems  Constitutional: Positive for fatigue.  HENT: Negative for mouth sores, mouth dryness and nose dryness.   Eyes: Negative for pain, itching and dryness.  Respiratory: Negative for shortness of breath and difficulty breathing.   Cardiovascular: Negative for chest pain and palpitations.  Gastrointestinal: Negative for blood in stool, constipation and diarrhea.  Endocrine: Negative for increased urination.  Genitourinary: Negative for difficulty urinating.  Musculoskeletal: Positive for arthralgias, joint pain, morning stiffness and muscle tenderness. Negative for joint swelling, myalgias and myalgias.  Skin: Negative for color change, rash and redness.  Allergic/Immunologic: Negative for susceptible to infections.  Neurological: Positive for headaches. Negative for numbness, memory loss and weakness.  Hematological: Positive for bruising/bleeding tendency.  Psychiatric/Behavioral: Positive for sleep disturbance. Negative for confusion.    PMFS History:  Patient Active Problem List   Diagnosis Date Noted  . Essential hypertension 01/04/2017  . Gastroesophageal reflux disease without esophagitis 01/04/2017  .  Fibromyalgia 01/03/2017  . Other fatigue 01/03/2017  . Primary insomnia 01/03/2017  . Plantar fasciitis 01/03/2017    Past Medical History:  Diagnosis Date  . Fatigue   . Fibromyalgia   . GERD (gastroesophageal reflux disease)   . Hypertension   . Plantar fasciitis     Family History  Problem Relation Age  of Onset  . Cancer Mother        pancreatic   . Heart disease Father   . COPD Father    Past Surgical History:  Procedure Laterality Date  . FLEXIBLE SIGMOIDOSCOPY  01/06/2021   Procedure: FLEXIBLE SIGMOIDOSCOPY;  Surgeon: Montez Morita, Quillian Quince, MD;  Location: [facility];  Service: Gastroenterology;;  . FOOT SURGERY Left 11/25/2019   Social History   Social History Narrative  . Not on file   Immunization History  Administered Date(s) Administered  . Influenza Inj Mdck Quad Pf 08/20/2019  . Influenza-Unspecified 08/28/2019  . Moderna Sars-Covid-2 Vaccination 01/23/2020, 02/25/2020     Objective: Vital Signs: BP 125/83 (BP Location: Left Arm, Patient Position: Sitting, Cuff Size: Large)   Pulse 78   Resp 15   Ht 5\' 5"  (1.651 m)   Wt 226 lb (102.5 kg)   BMI 37.61 kg/m    Physical Exam Vitals and nursing note reviewed.  Constitutional:      Appearance: She is well-developed.  HENT:     Head: Normocephalic and atraumatic.  Eyes:     Conjunctiva/sclera: Conjunctivae normal.  Pulmonary:     Effort: Pulmonary effort is normal.  Abdominal:     Palpations: Abdomen is soft.  Musculoskeletal:     Cervical back: Normal range of motion.  Skin:    General: Skin is warm and dry.     Capillary Refill: Capillary refill takes less than 2 seconds.  Neurological:     Mental Status: She is alert and oriented to person, place, and time.  Psychiatric:        Behavior: Behavior normal.      Musculoskeletal Exam: C-spine good ROM with no discomfort.  Thoracic and lumbar spine good ROM with no discomfort.  No midline spinal tenderness.  No SI joint tenderness.  Extremities have good range of motion on exam.  The left shoulder joint is tender in the subacromial space and posteriorly.  She also has tenderness over the left trapezius muscle and at the deltoid insertion site.  Elbow joints have good ROM with no discomfort.  No tenderness over lateral epicondyles at this time.  Right  forearm is in a cast.  Left wrist, MCPs, PIPs, and DIPs have good ROM with no discomfort or synovitis. Hip joints, knee joints, and ankle joints good ROM with no discomfort.  No warmth or effusion of knee joints.  No tenderness or swelling of ankle joints.   CDAI Exam: CDAI Score: -- Patient Global: --; Provider Global: -- Swollen: --; Tender: -- Joint Exam 01/20/2021   No joint exam has been documented for this visit   There is currently no information documented on the homunculus. Go to the Rheumatology activity and complete the homunculus joint exam.  Investigation: No additional findings.  Imaging: No results found.  Recent Labs: No results found for: WBC, HGB, PLT, NA, K, CL, CO2, GLUCOSE, BUN, CREATININE, BILITOT, ALKPHOS, AST, ALT, PROT, ALBUMIN, CALCIUM, GFRAA, QFTBGOLD, QFTBGOLDPLUS  Speciality Comments: No specialty comments available.  Procedures:  No procedures performed Allergies: Penicillins   Assessment / Plan:     Visit Diagnoses: Fibromyalgia: She continues to  experience intermittent myalgias and muscle tenderness due to underlying fibromyalgia.  She has had more frequent flares during the winter months due to the colder weather temperatures.  She has been experiencing increased nocturnal pain in the left shoulder which has worsened her insomnia.  Overall her level of fatigue has been stable.  She has ongoing trapezius muscle tension and muscle tenderness bilaterally.  She continues to take baclofen 10 mg twice daily as needed for muscle spasms.  She remains on Cymbalta 60 mg 1 capsule by mouth daily and Topamax 25 mg 1 tablet by mouth at bedtime.  We discussed the importance of regular exercise and good sleep hygiene.  Positive anti-CCP test: She has no clinical features of rheumatoid arthritis.  She has no synovitis on examination today.  She was advised to notify us if she develops increased joint pain or joint swelling.  Chronic left shoulder pain: She presents  today with intermittent left shoulder joint discomfort since July 2021.  She did not have any injury prior to the onset of symptoms.  Her discomfort has been episodic and is occasionally exacerbated by her activity level.  She has been experiencing intermittent numbness radiating all the way down her left arm to her hand.  She has also noticed some increased muscle weakness during these episodes.  Her pain has been severe and she rates it a 15 out of 10 at times. X-rays of the left shoulder were obtained on 05/12/2020 which were unremarkable.  Due to the chronicity and severity of her pain as well as episodic numbness that she has been experiencing we will proceed with an MRI of the left shoulder joint for further evaluation.    Primary osteoarthritis of right knee: She continues to experience intermittent discomfort in the right knee joint.  She has good range of motion of the right knee on examination today.  No warmth or effusion noted.  Chronic SI joint pain - Left-Resolved. No tenderness to palpation.  She had a cortisone injection performed on 05/12/20, which alleviated her discomfort.  Lateral epicondylitis of both elbows - Resolved.  No tenderness to palpation on exam.   Closed displaced fracture of proximal phalanx of right little finger, sequela - Worker's comp case being followed by Dr. Nicoletta Dress. She remains in a cast.  Trochanteric bursitis of both hips: She experiences intermittent discomfort but has no tenderness palpation on examination today.  She has good range of motion of both hip joints on exam.  Other fatigue: Chronic but stable.  Discussed the importance of regular exercise.  Primary insomnia: She is taking Topamax 25 mg 1 tablet by mouth daily.  Other medical conditions are listed as follows:   Gastroesophageal reflux disease without esophagitis  Essential hypertension  Plantar fasciitis - Resolved  Orders: Orders Placed This Encounter  Procedures  . MR SHOULDER LEFT WO  CONTRAST   No orders of the defined types were placed in this encounter.     Follow-Up Instructions: Return in about 6 months (around 07/23/2021) for Osteoarthritis, Fibromyalgia.   Ofilia Neas, PA-C  Note - This record has been created using Dragon software.  Chart creation errors have been sought, but may not always  have been located. Such creation errors do not reflect on  the standard of medical care.

## 2021-01-08 NOTE — OR Nursing (Signed)
Patient was scheduled to arrive at 0615 this am ,  Contacted her and she said this procedure was suppose to be cancelled yesterday due to insurance denied payment.

## 2021-01-12 ENCOUNTER — Other Ambulatory Visit: Payer: Self-pay | Admitting: *Deleted

## 2021-01-12 MED ORDER — TOPIRAMATE 25 MG PO TABS: 25.0000 mg | ORAL_TABLET | Freq: Every day | ORAL | 0 refills | Status: DC

## 2021-01-12 NOTE — Telephone Encounter (Signed)
RF faxed from [place]  Next Visit: 01/20/2021  Last Visit: 05/12/2020  Last Fill: 01/06/2017  Dx: Chronic SI joint pain   Current Dose per office note on 05/12/2020, not discussed   Okay to refill Topamax?

## 2021-01-13 ENCOUNTER — Encounter (HOSPITAL_COMMUNITY): Payer: Self-pay | Admitting: Gastroenterology

## 2021-01-20 ENCOUNTER — Other Ambulatory Visit: Payer: Self-pay

## 2021-01-20 ENCOUNTER — Encounter: Payer: Self-pay | Admitting: Physician Assistant

## 2021-01-20 ENCOUNTER — Ambulatory Visit: Payer: 59 | Admitting: Physician Assistant

## 2021-01-20 VITALS — BP 125/83 | HR 78 | Resp 15 | Ht 65.0 in | Wt 226.0 lb

## 2021-01-20 DIAGNOSIS — M797 Fibromyalgia: Secondary | ICD-10-CM

## 2021-01-20 DIAGNOSIS — G8929 Other chronic pain: Secondary | ICD-10-CM

## 2021-01-20 DIAGNOSIS — M1711 Unilateral primary osteoarthritis, right knee: Secondary | ICD-10-CM

## 2021-01-20 DIAGNOSIS — M25512 Pain in left shoulder: Secondary | ICD-10-CM

## 2021-01-20 DIAGNOSIS — M722 Plantar fascial fibromatosis: Secondary | ICD-10-CM

## 2021-01-20 DIAGNOSIS — M533 Sacrococcygeal disorders, not elsewhere classified: Secondary | ICD-10-CM

## 2021-01-20 DIAGNOSIS — R5383 Other fatigue: Secondary | ICD-10-CM

## 2021-01-20 DIAGNOSIS — I10 Essential (primary) hypertension: Secondary | ICD-10-CM

## 2021-01-20 DIAGNOSIS — F5101 Primary insomnia: Secondary | ICD-10-CM

## 2021-01-20 DIAGNOSIS — M7062 Trochanteric bursitis, left hip: Secondary | ICD-10-CM

## 2021-01-20 DIAGNOSIS — M7061 Trochanteric bursitis, right hip: Secondary | ICD-10-CM

## 2021-01-20 DIAGNOSIS — S62616S Displaced fracture of proximal phalanx of right little finger, sequela: Secondary | ICD-10-CM

## 2021-01-20 DIAGNOSIS — M7712 Lateral epicondylitis, left elbow: Secondary | ICD-10-CM

## 2021-01-20 DIAGNOSIS — R768 Other specified abnormal immunological findings in serum: Secondary | ICD-10-CM | POA: Diagnosis not present

## 2021-01-20 DIAGNOSIS — M7711 Lateral epicondylitis, right elbow: Secondary | ICD-10-CM

## 2021-01-20 DIAGNOSIS — K219 Gastro-esophageal reflux disease without esophagitis: Secondary | ICD-10-CM

## 2021-02-01 ENCOUNTER — Telehealth: Payer: Self-pay | Admitting: Physician Assistant

## 2021-02-01 NOTE — Telephone Encounter (Signed)
I called the patient to review MRI results from 01/26/21: Moderate rotator cuff tendinopathy most notably involving the supraspinatus tendon with interstitial tears and shallow oblique coursing bursal surface tear.  No full-thickness retracted tear.  Degenerated and torn superior labrum.  There is thickening of the capsular structures in the axillary recess which can be seen with adhesive capsulitis or synovitis.  Mild subacromial/subdeltoid bursitis. All questions were addressed.  A referral to Dr. Marlou Sa will be placed today for further evaluation and management.    Hazel Sams, PA-C

## 2021-02-02 ENCOUNTER — Other Ambulatory Visit: Payer: Self-pay | Admitting: *Deleted

## 2021-02-02 DIAGNOSIS — M25512 Pain in left shoulder: Secondary | ICD-10-CM

## 2021-02-12 ENCOUNTER — Other Ambulatory Visit: Payer: Self-pay

## 2021-02-12 ENCOUNTER — Ambulatory Visit: Payer: 59 | Admitting: Orthopedic Surgery

## 2021-02-12 ENCOUNTER — Ambulatory Visit: Payer: Self-pay

## 2021-02-12 DIAGNOSIS — M25512 Pain in left shoulder: Secondary | ICD-10-CM | POA: Diagnosis not present

## 2021-02-12 NOTE — Progress Notes (Signed)
Subjective: Patient is here for ultrasound-guided intra-articular left glenohumeral injection.  Intermittent anterior pain.  Objective:  Full ROM today, no adhesive capsulitis.  Procedure: Ultrasound guided injection is preferred based studies that show increased duration, increased effect, greater accuracy, decreased procedural pain, increased response rate, and decreased cost with ultrasound guided versus blind injection.   Verbal informed consent obtained.  Time-out conducted.  Noted no overlying erythema, induration, or other signs of local infection. Ultrasound-guided left glenohumeral injection: After sterile prep with Betadine, injected 4 cc 0.25% bupivocaine without epinephrine and 6 mg betamethasone using a 22-gauge spinal needle, passing the needle from posterior approach into the glenohumeral joint.  Injectate seen filling joint capsule.

## 2021-02-13 ENCOUNTER — Encounter: Payer: Self-pay | Admitting: Orthopedic Surgery

## 2021-02-13 NOTE — Progress Notes (Signed)
Office Visit Note   Patient: Katie Branch           Date of Birth: 1971-03-19           MRN: [id number] Visit Date: 02/12/2021 Requested by: Ofilia Neas, PA-C [address] [place],  [place] [address] PCP: Monico Blitz, MD  Subjective: Chief Complaint  Patient presents with  . Left Shoulder - Pain    HPI: Mystery is a 50 year old patient with left shoulder pain.  She is had pain for over a year on and off but has been worse over the last 6 months.  She states she has good and bad days.  The pain will wake her from sleep at night.  She is right-hand dominant.  At times she is unable to use her arm.  This is due to sharp shooting jabbing type pain within which joint itself.  Overhead motion is worse.  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  The pain does radiate down the arm.  She localizes most of her pain to the axillary region.  She has had subacromial injections which have not really helped much.  MRI scan performed recently was reviewed.  She does have some rotator cuff tendinosis with degenerative and torn superior labrum.              ROS: All systems reviewed are negative as they relate to the chief complaint within the history of present illness.  Patient denies  fevers or chills.  Assessment & Plan: Visit Diagnoses:  1. Left shoulder pain, unspecified chronicity     Plan: Impression is left shoulder pain with possible superior labrum source of the pain.  Rotator cuff strength is intact and no real clinical evidence on exam of restricted motion consistent with adhesive capsulitis.  For that reason I think intra-articular injection would be indicated.  That was performed today and we will see her back in 6 weeks with decision at that time for or against arthroscopy with biceps tendon release and biceps tenodesis. Follow-Up Instructions: No follow-ups on file.   Orders:  Orders Placed This Encounter  Procedures  . US Guided Needle Placement - No Linked  Charges   No orders of the defined types were placed in this encounter.     Procedures: No procedures performed   Clinical Data: No additional findings.  Objective: Vital Signs: There were no vitals taken for this visit.  Physical Exam:   Constitutional: Patient appears well-developed HEENT:  Head: Normocephalic Eyes:EOM are normal Neck: Normal range of motion Cardiovascular: Normal rate Pulmonary/chest: Effort normal Neurologic: Patient is alert Skin: Skin is warm Psychiatric: Patient has normal mood and affect    Ortho Exam: Ortho exam demonstrates good cervical spine range of motion.  Passive range of motion left shoulder is 55/95/170.  Patient has 5 out of 5 grip EPL FPL interosseous wrist flexion extension bicep triceps and deltoid strength bilaterally with good rotator cuff strength isolated infraspinatus supraspinatus and subscap muscle testing in the left and right shoulder.  No discrete AC joint tenderness to direct palpation.  She does have a click with internal ex rotation of that left shoulder at 90 degrees of abduction which is not present on the right-hand side.  No masses lymphadenopathy or skin changes noted in that left shoulder girdle region.  Specialty Comments:  No specialty comments available.  Imaging: US Guided Needle Placement - No Linked Charges  Result Date: 02/12/2021 Ultrasound guided injection is  preferred based studies that show increased duration, increased effect, greater accuracy, decreased procedural pain, increased response rate, and decreased cost with ultrasound guided versus blind injection.   Verbal informed consent obtained.  Time-out conducted.  Noted no overlying erythema, induration, or other signs of local infection. Ultrasound-guided left glenohumeral injection: After sterile prep with Betadine, injected 4 cc 0.25% bupivocaine without epinephrine and 6 mg betamethasone using a 22-gauge spinal needle, passing the needle from posterior  approach into the glenohumeral joint.  Injectate seen filling joint capsule.      PMFS History: Patient Active Problem List   Diagnosis Date Noted  . Essential hypertension 01/04/2017  . Gastroesophageal reflux disease without esophagitis 01/04/2017  . Fibromyalgia 01/03/2017  . Other fatigue 01/03/2017  . Primary insomnia 01/03/2017  . Plantar fasciitis 01/03/2017   Past Medical History:  Diagnosis Date  . Fatigue   . Fibromyalgia   . GERD (gastroesophageal reflux disease)   . Hypertension   . Plantar fasciitis     Family History  Problem Relation Age of Onset  . Cancer Mother        pancreatic   . Heart disease Father   . COPD Father     Past Surgical History:  Procedure Laterality Date  . FLEXIBLE SIGMOIDOSCOPY  01/06/2021   Procedure: FLEXIBLE SIGMOIDOSCOPY;  Surgeon: Montez Morita, Quillian Quince, MD;  Location: [facility];  Service: Gastroenterology;;  . FOOT SURGERY Left 11/25/2019   Social History   Occupational History  . Not on file  Tobacco Use  . Smoking status: Never Smoker  . Smokeless tobacco: Never Used  Vaping Use  . Vaping Use: Never used  Substance and Sexual Activity  . Alcohol use: Yes    Comment: once a month  . Drug use: Never  . Sexual activity: Not on file

## 2021-04-17 ENCOUNTER — Other Ambulatory Visit: Payer: Self-pay | Admitting: Rheumatology

## 2021-04-19 NOTE — Telephone Encounter (Signed)
Next Visit: 07/21/2021  Last Visit: 01/20/2021  Last Fill: 01/12/2021  Dx:  Fibromyalgia  Current Dose per office note on 01/20/2021: Topamax 25 mg 1 tablet by mouth at bedtime  Okay to refill per Dr. Estanislado Pandy

## 2021-07-08 NOTE — Progress Notes (Signed)
Office Visit Note  Patient: Katie Branch             Date of Birth: 02/02/1971           MRN: [id number]             PCP: Monico Blitz, MD Referring: Monico Blitz, MD Visit Date: 07/21/2021 Occupation: '@GUAROCC'$ @  Subjective:  Fatigue, right knee joint pain   History of Present Illness: Katie Branch is a 50 y.o. female with a history of fibromyalgia and osteoarthritis.  She states for her left shoulder joint pain she was evaluated by Dr.Dean who suspected superior labral tear.  She had left shoulder joint injection under ultrasound.  She had relief from the shoulder joint injection.  The pain has returned now.  She has been experiencing some discomfort in her wrist joints.  She states her lower back and SI joints continue to hurt.  She has been having increased pain and discomfort in her knee joints.  She states her right knee joint is especially painful.  She has not noticed any swelling.  She has tried weight loss diet and exercise without much results.  She also had cortisone injection in the past which did not last for more than few weeks.  She states that fibromyalgia is also flaring and she has been experiencing increased fatigue.  Activities of Daily Living:  Patient reports morning stiffness for 1 hour.   Patient Reports nocturnal pain.  Difficulty dressing/grooming: Denies Difficulty climbing stairs: Reports Difficulty getting out of chair: Denies Difficulty using hands for taps, buttons, cutlery, and/or writing: Denies  Review of Systems  Constitutional:  Positive for fatigue.  HENT:  Positive for mouth dryness.   Eyes:  Negative for dryness.  Respiratory:  Negative for shortness of breath.   Cardiovascular:  Negative for swelling in legs/feet.  Gastrointestinal:  Negative for constipation.  Endocrine: Positive for excessive thirst.  Genitourinary:  Negative for difficulty urinating.  Musculoskeletal:  Positive for joint pain, joint pain, morning stiffness and muscle  tenderness.  Skin:  Negative for rash.  Allergic/Immunologic: Negative for susceptible to infections.  Neurological:  Negative for numbness.  Hematological:  Positive for bruising/bleeding tendency.  Psychiatric/Behavioral:  Positive for sleep disturbance.    PMFS History:  Patient Active Problem List   Diagnosis Date Noted   Essential hypertension 01/04/2017   Gastroesophageal reflux disease without esophagitis 01/04/2017   Fibromyalgia 01/03/2017   Other fatigue 01/03/2017   Primary insomnia 01/03/2017   Plantar fasciitis 01/03/2017    Past Medical History:  Diagnosis Date   Fatigue    Fibromyalgia    GERD (gastroesophageal reflux disease)    Hypertension    Plantar fasciitis     Family History  Problem Relation Age of Onset   Cancer Mother        pancreatic    Heart disease Father    COPD Father    Past Surgical History:  Procedure Laterality Date   FLEXIBLE SIGMOIDOSCOPY  01/06/2021   Procedure: FLEXIBLE SIGMOIDOSCOPY;  Surgeon: Harvel Quale, MD;  Location: [facility];  Service: Gastroenterology;;   FOOT SURGERY Left 11/25/2019   Social History   Social History Narrative   Not on file   Immunization History  Administered Date(s) Administered   Influenza Inj Mdck Quad Pf 08/20/2019   Influenza-Unspecified 08/28/2019   Moderna Sars-Covid-2 Vaccination 01/23/2020, 02/25/2020     Objective: Vital Signs: BP 114/75 (BP Location: Left Arm, Patient Position: Sitting, Cuff Size: Normal)  Pulse 75   Resp 16   Ht '5\' 5"'$  (1.651 m)   Wt 230 lb (104.3 kg)   BMI 38.27 kg/m    Physical Exam Vitals and nursing note reviewed.  Constitutional:      Appearance: She is well-developed.  HENT:     Head: Normocephalic and atraumatic.  Eyes:     Conjunctiva/sclera: Conjunctivae normal.  Cardiovascular:     Rate and Rhythm: Normal rate and regular rhythm.     Heart sounds: Normal heart sounds.  Pulmonary:     Effort: Pulmonary effort is normal.      Breath sounds: Normal breath sounds.  Abdominal:     General: Bowel sounds are normal.     Palpations: Abdomen is soft.  Musculoskeletal:     Cervical back: Normal range of motion.  Lymphadenopathy:     Cervical: No cervical adenopathy.  Skin:    General: Skin is warm and dry.     Capillary Refill: Capillary refill takes less than 2 seconds.  Neurological:     Mental Status: She is alert and oriented to person, place, and time.  Psychiatric:        Behavior: Behavior normal.     Musculoskeletal Exam: C-spine was in good range of motion.  Shoulder joints, elbow joints, wrist joints, MCPs PIPs and DIPs with good range of motion with no synovitis.  Hip joints and knee joints with good range of motion.  She had warmth on palpation of her right knee joint.  There is no tenderness over ankles or MTPs.  CDAI Exam: CDAI Score: -- Patient Global: --; Provider Global: -- Swollen: --; Tender: -- Joint Exam 07/21/2021   No joint exam has been documented for this visit   There is currently no information documented on the homunculus. Go to the Rheumatology activity and complete the homunculus joint exam.  Investigation: No additional findings.  Imaging: XR KNEE 3 VIEW RIGHT  Result Date: 07/21/2021 Mild to moderate medial compartment narrowing was noted.  Mild patellofemoral narrowing was noted.  No chondrocalcinosis was noted.  No radiographic progression was noted when compared to the x-rays of 2019. Impression: These findings are consistent with mild to moderate osteoarthritis and mild chondromalacia patella.   Recent Labs: No results found for: WBC, HGB, PLT, NA, K, CL, CO2, GLUCOSE, BUN, CREATININE, BILITOT, ALKPHOS, AST, ALT, PROT, ALBUMIN, CALCIUM, GFRAA, QFTBGOLD, QFTBGOLDPLUS  Speciality Comments: No specialty comments available.  Procedures:  No procedures performed Allergies: Penicillins   Assessment / Plan:     Visit Diagnoses: Chronic inflammatory polyarthritis-she had  weak positive anti-CCP, negative RF and -'14 3 3 '$ eta in the past.  She had warmth on palpation of her right knee joint.  She states she has been having intermittent discomfort and increased pain in her joints.  The left shoulder joint pain has recurred after the cortisone injection.  She also gives history of intermittent discomfort in her wrist joints.  Discussed possibility of rheumatoid arthritis.  Detailed counseling was provided.  I discussed the option of trying hydroxychloroquine for the next 3 to 4 months.  She was in agreement.  Handout was given and consent was taken.  My plan is to start her on hydroxychloroquine 200 mg p.o. twice daily Monday to Friday.  We will check labs in a month, 3 months and then every 5 months.  She has been advised to have baseline eye examination and then eye examination on an yearly basis.  Patient was counseled on the purpose, proper  use, and adverse effects of hydroxychloroquine including nausea/diarrhea, skin rash, headaches, and sun sensitivity.  Advised patient to wear sunscreen once starting hydroxychloroquine to reduce risk of rash associated with sun sensitivity.  Discussed importance of annual eye exams while on hydroxychloroquine to monitor to ocular toxicity and discussed importance of frequent laboratory monitoring.  Provided patient with eye exam form for baseline ophthalmologic exam.  Reviewed risk for QTC prolongation when used in combination with other QTc prolonging agents (including but not limited to antiarrhythmics, macrolide antibiotics, flouroquinolones, tricyclic antidepressants, citalopram, specific antipsychotics, ondansetron, migraine triptans, and methadone). Provided patient with educational materials on hydroxychloroquine and answered all questions.  Patient consented to hydroxychloroquine. Will upload consent in the media tab.    Chronic left shoulder pain-she was evaluated by Dr. Marlou Sa in April 2022.  She states she had left shoulder joint  injection which was helpful but the pain has recurred now.  Trochanteric bursitis of both hips-she has off-and-on discomfort in the trochanteric area.  IT band stretching exercises were discussed.  Chronic pain of right knee -she has been experiencing increased pain and discomfort in her right knee joint.  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  She has some warmth on palpation of her right knee joint.  She has tried cortisone injection in the past which do not last for long time.  She has tried anti-inflammatories and cortisone injection without much relief.  Plan: XR KNEE 3 VIEW RIGHT.  X-ray showed mild to moderate osteoarthritis and mild chondromalacia patella.  There was no radiographic progression when compared to the x-rays of 2019.  Primary osteoarthritis of right knee-I reviewed her previous x-rays from 2018 and 2019 which showed bilateral moderate osteoarthritis and chondromalacia patella.  Chronic SI joint pain - Left-Resolved. She had a cortisone injection performed on 05/12/20.  Fibromyalgia -she is on baclofen 10 mg twice daily as needed for muscle spasms, Cymbalta 60 mg 1 capsule by mouth daily and Topamax 25 mg 1 tablet by mouth at bedtime.  She states she is having a flare of fibromyalgia with the weather change.  Other fatigue-she has been experiencing increased fatigue due to fibromyalgia.  Primary insomnia-good sleep hygiene was discussed.  Essential hypertension-blood pressure is normal.  Gastroesophageal reflux disease without esophagitis-she has reflux.  She does not tolerate NSAIDs.  Orders: Orders Placed This Encounter  Procedures   XR KNEE 3 VIEW RIGHT   CBC with Differential/Platelet   COMPLETE METABOLIC PANEL WITH GFR   Sedimentation rate   Rheumatoid factor   Cyclic citrul peptide antibody, IgG   Glucose 6 phosphate dehydrogenase    Meds ordered this encounter  Medications   hydroxychloroquine (PLAQUENIL) 200 MG tablet    Sig:  Take '200mg'$  by mouth twice daily Monday through Friday only. None on Saturday or Sunday.    Dispense:  40 tablet    Refill:  2      Follow-Up Instructions: Return in about 6 weeks (around 09/01/2021) for Inflammatory arthritis.   Bo Merino, MD  Note - This record has been created using Editor, commissioning.  Chart creation errors have been sought, but may not always  have been located. Such creation errors do not reflect on  the standard of medical care.

## 2021-07-21 ENCOUNTER — Ambulatory Visit: Payer: Self-pay

## 2021-07-21 ENCOUNTER — Ambulatory Visit: Payer: 59 | Admitting: Rheumatology

## 2021-07-21 ENCOUNTER — Other Ambulatory Visit: Payer: Self-pay

## 2021-07-21 ENCOUNTER — Encounter: Payer: Self-pay | Admitting: Rheumatology

## 2021-07-21 VITALS — BP 114/75 | HR 75 | Resp 16 | Ht 65.0 in | Wt 230.0 lb

## 2021-07-21 DIAGNOSIS — F5101 Primary insomnia: Secondary | ICD-10-CM

## 2021-07-21 DIAGNOSIS — M533 Sacrococcygeal disorders, not elsewhere classified: Secondary | ICD-10-CM

## 2021-07-21 DIAGNOSIS — S62616S Displaced fracture of proximal phalanx of right little finger, sequela: Secondary | ICD-10-CM

## 2021-07-21 DIAGNOSIS — M7711 Lateral epicondylitis, right elbow: Secondary | ICD-10-CM

## 2021-07-21 DIAGNOSIS — M722 Plantar fascial fibromatosis: Secondary | ICD-10-CM

## 2021-07-21 DIAGNOSIS — M7062 Trochanteric bursitis, left hip: Secondary | ICD-10-CM

## 2021-07-21 DIAGNOSIS — M064 Inflammatory polyarthropathy: Secondary | ICD-10-CM

## 2021-07-21 DIAGNOSIS — M7061 Trochanteric bursitis, right hip: Secondary | ICD-10-CM | POA: Diagnosis not present

## 2021-07-21 DIAGNOSIS — R5383 Other fatigue: Secondary | ICD-10-CM

## 2021-07-21 DIAGNOSIS — M25512 Pain in left shoulder: Secondary | ICD-10-CM | POA: Diagnosis not present

## 2021-07-21 DIAGNOSIS — Z79899 Other long term (current) drug therapy: Secondary | ICD-10-CM

## 2021-07-21 DIAGNOSIS — G8929 Other chronic pain: Secondary | ICD-10-CM | POA: Diagnosis not present

## 2021-07-21 DIAGNOSIS — M25561 Pain in right knee: Secondary | ICD-10-CM

## 2021-07-21 DIAGNOSIS — M797 Fibromyalgia: Secondary | ICD-10-CM

## 2021-07-21 DIAGNOSIS — M1711 Unilateral primary osteoarthritis, right knee: Secondary | ICD-10-CM

## 2021-07-21 DIAGNOSIS — I10 Essential (primary) hypertension: Secondary | ICD-10-CM

## 2021-07-21 DIAGNOSIS — K219 Gastro-esophageal reflux disease without esophagitis: Secondary | ICD-10-CM

## 2021-07-21 DIAGNOSIS — R768 Other specified abnormal immunological findings in serum: Secondary | ICD-10-CM

## 2021-07-21 MED ORDER — HYDROXYCHLOROQUINE SULFATE 200 MG PO TABS: ORAL_TABLET | ORAL | 2 refills | Status: DC

## 2021-07-21 NOTE — Patient Instructions (Addendum)
Standing Labs We placed an order today for your standing lab work.   Please have your standing labs drawn in in 1 month, 3 months and then every 5 months.  If possible, please have your labs drawn 2 weeks prior to your appointment so that the provider can discuss your results at your appointment.  Please note that you may see your imaging and lab results in [facility] before we have reviewed them. We may be awaiting multiple results to interpret others before contacting you. Please allow our office up to 72 hours to thoroughly review all of the results before contacting the office for clarification of your results.  We have open lab daily: Monday through Thursday from 1:30-4:30 PM and Friday from 1:30-4:00 PM at the office of Dr. Bo Merino, [facility].   Please be advised, all patients with office appointments requiring lab work will take precedent over walk-in lab work.  If possible, please come for your lab work on Monday and Friday afternoons, as you may experience shorter wait times. The office is located at [address], [place], [place], [place] [address] No appointment is necessary.   Labs are drawn by [facility]. Please bring your co-pay at the time of your lab draw.  You may receive a bill from [facility] for your lab work.  If you wish to have your labs drawn at another location, please call the office 24 hours in advance to send orders.  If you have any questions regarding directions or hours of operation,  please call [phone].   As a reminder, please drink plenty of water prior to coming for your lab work. Thanks! Hydroxychloroquine Tablets What is this medication? HYDROXYCHLOROQUINE (hye drox ee KLOR oh kwin) treats autoimmune conditions, such as rheumatoid arthritis and lupus. It works by slowing down an overactive immune system. It may also be used to prevent and treat malaria. It works by killing the parasite that causes malaria. It belongs to a group of  medications called DMARDs. This medicine may be used for other purposes; ask your health care provider or pharmacist if you have questions. COMMON BRAND NAME(S): Plaquenil, Quineprox What should I tell my care team before I take this medication? They need to know if you have any of these conditions: Diabetes Eye disease, vision problems G6PD deficiency Heart disease History of irregular heartbeat If you often drink alcohol Kidney disease Liver disease Porphyria Psoriasis An unusual or allergic reaction to chloroquine, hydroxychloroquine, other medications, foods, dyes, or preservatives Pregnant or trying to get pregnant Breast-feeding How should I use this medication? Take this medication by mouth with a glass of water. Take it as directed on the prescription label. Do not cut, crush or chew this medication. Swallow the tablets whole. Take it with food. Do not take it more than directed. Take all of this medication unless your care team tells you to stop it early. Keep taking it even if you think you are better. Take products with antacids in them at a different time of day than this medication. Take this medication 4 hours before or 4 hours after antacids. Talk to your care team if you have questions. Talk to your care team about the use of this medication in children. While this medication may be prescribed for selected conditions, precautions do apply. Overdosage: If you think you have taken too much of this medicine contact a poison control center or emergency room at once. NOTE: This medicine is only for you. Do not share this  medicine with others. What if I miss a dose? If you miss a dose, take it as soon as you can. If it is almost time for your next dose, take only that dose. Do not take double or extra doses. What may interact with this medication? Do not take this medication with any of the following: Cisapride Dronedarone Pimozide Thioridazine This medication may also  interact with the following: Ampicillin Antacids Cimetidine Cyclosporine Digoxin Kaolin Medications for diabetes, like insulin, glipizide, glyburide Medications for seizures like carbamazepine, phenobarbital, phenytoin Mefloquine Methotrexate Other medications that prolong the QT interval (cause an abnormal heart rhythm) Praziquantel This list may not describe all possible interactions. Give your health care provider a list of all the medicines, herbs, non-prescription drugs, or dietary supplements you use. Also tell them if you smoke, drink alcohol, or use illegal drugs. Some items may interact with your medicine. What should I watch for while using this medication? Visit your care team for regular checks on your progress. Tell your care team if your symptoms do not start to get better or if they get worse. You may need blood work done while you are taking this medication. If you take other medications that can affect heart rhythm, you may need more testing. Talk to your care team if you have questions. Your vision may be tested before and during use of this medication. Tell your care team right away if you have any change in your eyesight. This medication may cause serious skin reactions. They can happen weeks to months after starting the medication. Contact your care team right away if you notice fevers or flu-like symptoms with a rash. The rash may be red or purple and then turn into blisters or peeling of the skin. Or, you might notice a red rash with swelling of the face, lips or lymph nodes in your neck or under your arms. If you or your family notice any changes in your behavior, such as new or worsening depression, thoughts of harming yourself, anxiety, or other unusual or disturbing thoughts, or memory loss, call your care team right away. What side effects may I notice from receiving this medication? Side effects that you should report to your care team as soon as possible: Allergic  reactions-skin rash, itching, hives, swelling of the face, lips, tongue, or throat Aplastic anemia-unusual weakness or fatigue, dizziness, headache, trouble breathing, increased bleeding or bruising Change in vision Heart rhythm changes-fast or irregular heartbeat, dizziness, feeling faint or lightheaded, chest pain, trouble breathing Infection-fever, chills, cough, or sore throat Low blood sugar (hypoglycemia)-tremors or shaking, anxiety, sweating, cold or clammy skin, confusion, dizziness, rapid heartbeat Muscle injury-unusual weakness or fatigue, muscle pain, dark yellow or brown urine, decrease in amount of urine Pain, tingling, or numbness in the hands or feet Rash, fever, and swollen lymph nodes Redness, blistering, peeling, or loosening of the skin, including inside the mouth Thoughts of suicide or self-harm, worsening mood, or feelings of depression Unusual bruising or bleeding Side effects that usually do not require medical attention (report to your care team if they continue or are bothersome): Diarrhea Headache Nausea Stomach pain Vomiting This list may not describe all possible side effects. Call your doctor for medical advice about side effects. You may report side effects to FDA at 1-800-FDA-1088. Where should I keep my medication? Keep out of the reach of children and pets. Store at room temperature up to 30 degrees C (86 degrees F). Protect from light. Get rid of any unused medication after  the expiration date. To get rid of medications that are no longer needed or have expired: Take the medication to a medication take-back program. Check with your pharmacy or law enforcement to find a location. If you cannot return the medication, check the label or package insert to see if the medication should be thrown out in the garbage or flushed down the toilet. If you are not sure, ask your care team. If it is safe to put it in the trash, empty the medication out of the container. Mix  the medication with cat litter, dirt, coffee grounds, or other unwanted substance. Seal the mixture in a bag or container. Put it in the trash. NOTE: This sheet is a summary. It may not cover all possible information. If you have questions about this medicine, talk to your doctor, pharmacist, or health care provider.  2022 Elsevier/Gold Standard (2021-01-19 10:19:21)  Vaccines You are taking a medication(s) that can suppress your immune system.  The following immunizations are recommended: Flu annually Covid-19  Td/Tdap (tetanus, diphtheria, pertussis) every 10 years Pneumonia (Prevnar 15 then Pneumovax 23 at least 1 year apart.  Alternatively, can take Prevnar 20 without needing additional dose) Shingrix: 2 doses from 4 weeks to 6 months apart  Please check with your PCP to make sure you are up to date.  Knee Exercises Ask your health care provider which exercises are safe for you. Do exercises exactly as told by your health care provider and adjust them as directed. It is normal to feel mild stretching, pulling, tightness, or discomfort as you do these exercises. Stop right away if you feel sudden pain or your pain gets worse. Do not begin these exercises until told by your health care provider. Stretching and range-of-motion exercises These exercises warm up your muscles and joints and improve the movement and flexibility of your knee. These exercises also help to relieve pain and swelling. Knee extension, prone Lie on your abdomen (prone position) on a bed. Place your left / right knee just beyond the edge of the surface so your knee is not on the bed. You can put a towel under your left / right thigh just above your kneecap for comfort. Relax your leg muscles and allow gravity to straighten your knee (extension). You should feel a stretch behind your left / right knee. Hold this position for __________ seconds. Scoot up so your knee is supported between repetitions. Repeat __________  times. Complete this exercise __________ times a day. Knee flexion, active  Lie on your back with both legs straight. If this causes back discomfort, bend your left / right knee so your foot is flat on the floor. Slowly slide your left / right heel back toward your buttocks. Stop when you feel a gentle stretch in the front of your knee or thigh (flexion). Hold this position for __________ seconds. Slowly slide your left / right heel back to the starting position. Repeat __________ times. Complete this exercise __________ times a day. Quadriceps stretch, prone  Lie on your abdomen on a firm surface, such as a bed or padded floor. Bend your left / right knee and hold your ankle. If you cannot reach your ankle or pant leg, loop a belt around your foot and grab the belt instead. Gently pull your heel toward your buttocks. Your knee should not slide out to the side. You should feel a stretch in the front of your thigh and knee (quadriceps). Hold this position for __________ seconds. Repeat __________ times.  Complete this exercise __________ times a day. Hamstring, supine Lie on your back (supine position). Loop a belt or towel over the ball of your left / right foot. The ball of your foot is on the walking surface, right under your toes. Straighten your left / right knee and slowly pull on the belt to raise your leg until you feel a gentle stretch behind your knee (hamstring). Do not let your knee bend while you do this. Keep your other leg flat on the floor. Hold this position for __________ seconds. Repeat __________ times. Complete this exercise __________ times a day. Strengthening exercises These exercises build strength and endurance in your knee. Endurance is the ability to use your muscles for a long time, even after they get tired. Quadriceps, isometric This exercise stretches the muscles in front of your thigh (quadriceps) without moving your knee joint (isometric). Lie on your back  with your left / right leg extended and your other knee bent. Put a rolled towel or small pillow under your knee if told by your health care provider. Slowly tense the muscles in the front of your left / right thigh. You should see your kneecap slide up toward your hip or see increased dimpling just above the knee. This motion will push the back of the knee toward the floor. For __________ seconds, hold the muscle as tight as you can without increasing your pain. Relax the muscles slowly and completely. Repeat __________ times. Complete this exercise __________ times a day. Straight leg raises This exercise stretches the muscles in front of your thigh (quadriceps) and the muscles that move your hips (hip flexors). Lie on your back with your left / right leg extended and your other knee bent. Tense the muscles in the front of your left / right thigh. You should see your kneecap slide up or see increased dimpling just above the knee. Your thigh may even shake a bit. Keep these muscles tight as you raise your leg 4-6 inches (10-15 cm) off the floor. Do not let your knee bend. Hold this position for __________ seconds. Keep these muscles tense as you lower your leg. Relax your muscles slowly and completely after each repetition. Repeat __________ times. Complete this exercise __________ times a day. Hamstring, isometric Lie on your back on a firm surface. Bend your left / right knee about __________ degrees. Dig your left / right heel into the surface as if you are trying to pull it toward your buttocks. Tighten the muscles in the back of your thighs (hamstring) to "dig" as hard as you can without increasing any pain. Hold this position for __________ seconds. Release the tension gradually and allow your muscles to relax completely for __________ seconds after each repetition. Repeat __________ times. Complete this exercise __________ times a day. Hamstring curls If told by your health care  provider, do this exercise while wearing ankle weights. Begin with __________ lb weights. Then increase the weight by 1 lb (0.5 kg) increments. Do not wear ankle weights that are more than __________ lb. Lie on your abdomen with your legs straight. Bend your left / right knee as far as you can without feeling pain. Keep your hips flat against the floor. Hold this position for __________ seconds. Slowly lower your leg to the starting position. Repeat __________ times. Complete this exercise __________ times a day. Squats This exercise strengthens the muscles in front of your thigh and knee (quadriceps). Stand in front of a table, with your feet and  knees pointing straight ahead. You may rest your hands on the table for balance but not for support. Slowly bend your knees and lower your hips like you are going to sit in a chair. Keep your weight over your heels, not over your toes. Keep your lower legs upright so they are parallel with the table legs. Do not let your hips go lower than your knees. Do not bend lower than told by your health care provider. If your knee pain increases, do not bend as low. Hold the squat position for __________ seconds. Slowly push with your legs to return to standing. Do not use your hands to pull yourself to standing. Repeat __________ times. Complete this exercise __________ times a day. Wall slides This exercise strengthens the muscles in front of your thigh and knee (quadriceps). Lean your back against a smooth wall or door, and walk your feet out 18-24 inches (46-61 cm) from it. Place your feet hip-width apart. Slowly slide down the wall or door until your knees bend __________ degrees. Keep your knees over your heels, not over your toes. Keep your knees in line with your hips. Hold this position for __________ seconds. Repeat __________ times. Complete this exercise __________ times a day. Straight leg raises This exercise strengthens the muscles that  rotate the leg at the hip and move it away from your body (hip abductors). Lie on your side with your left / right leg in the top position. Lie so your head, shoulder, knee, and hip line up. You may bend your bottom knee to help you keep your balance. Roll your hips slightly forward so your hips are stacked directly over each other and your left / right knee is facing forward. Leading with your heel, lift your top leg 4-6 inches (10-15 cm). You should feel the muscles in your outer hip lifting. Do not let your foot drift forward. Do not let your knee roll toward the ceiling. Hold this position for __________ seconds. Slowly return your leg to the starting position. Let your muscles relax completely after each repetition. Repeat __________ times. Complete this exercise __________ times a day. Straight leg raises This exercise stretches the muscles that move your hips away from the front of the pelvis (hip extensors). Lie on your abdomen on a firm surface. You can put a pillow under your hips if that is more comfortable. Tense the muscles in your buttocks and lift your left / right leg about 4-6 inches (10-15 cm). Keep your knee straight as you lift your leg. Hold this position for __________ seconds. Slowly lower your leg to the starting position. Let your leg relax completely after each repetition. Repeat __________ times. Complete this exercise __________ times a day. This information is not intended to replace advice given to you by your health care provider. Make sure you discuss any questions you have with your health care provider. Document Revised: 07/31/2018 Document Reviewed: 07/31/2018 Elsevier Patient Education  2022 [employer].

## 2021-07-25 LAB — CBC WITH DIFFERENTIAL/PLATELET
Absolute Monocytes: 783 cells/uL (ref 200–950)
Basophils Absolute: 63 cells/uL (ref 0–200)
Basophils Relative: 0.7 %
Eosinophils Absolute: 243 cells/uL (ref 15–500)
Eosinophils Relative: 2.7 %
HCT: 39.5 % (ref 35.0–45.0)
Hemoglobin: 13 g/dL (ref 11.7–15.5)
Lymphs Abs: 1863 cells/uL (ref 850–3900)
MCH: 29.9 pg (ref 27.0–33.0)
MCHC: 32.9 g/dL (ref 32.0–36.0)
MCV: 90.8 fL (ref 80.0–100.0)
MPV: 9.9 fL (ref 7.5–12.5)
Monocytes Relative: 8.7 %
Neutro Abs: 6048 cells/uL (ref 1500–7800)
Neutrophils Relative %: 67.2 %
Platelets: 300 10*3/uL (ref 140–400)
RBC: 4.35 10*6/uL (ref 3.80–5.10)
RDW: 12.7 % (ref 11.0–15.0)
Total Lymphocyte: 20.7 %
WBC: 9 10*3/uL (ref 3.8–10.8)

## 2021-07-25 LAB — COMPLETE METABOLIC PANEL WITH GFR
AG Ratio: 1.6 (calc) (ref 1.0–2.5)
ALT: 17 U/L (ref 6–29)
AST: 17 U/L (ref 10–35)
Albumin: 4.4 g/dL (ref 3.6–5.1)
Alkaline phosphatase (APISO): 92 U/L (ref 37–153)
BUN: 12 mg/dL (ref 7–25)
CO2: 26 mmol/L (ref 20–32)
Calcium: 9.6 mg/dL (ref 8.6–10.4)
Chloride: 104 mmol/L (ref 98–110)
Creat: 0.81 mg/dL (ref 0.50–1.03)
Globulin: 2.7 g/dL (calc) (ref 1.9–3.7)
Glucose, Bld: 88 mg/dL (ref 65–99)
Potassium: 4.1 mmol/L (ref 3.5–5.3)
Sodium: 138 mmol/L (ref 135–146)
Total Bilirubin: 0.4 mg/dL (ref 0.2–1.2)
Total Protein: 7.1 g/dL (ref 6.1–8.1)
eGFR: 88 mL/min/{1.73_m2} (ref >=60)

## 2021-07-25 LAB — SEDIMENTATION RATE: Sed Rate: 22 mm/h (ref 0–30)

## 2021-07-25 LAB — RHEUMATOID FACTOR: Rheumatoid fact SerPl-aCnc: <14 IU/mL (ref <14)

## 2021-07-25 LAB — GLUCOSE 6 PHOSPHATE DEHYDROGENASE: G-6PDH: 15.9 U/g Hgb (ref 7.0–20.5)

## 2021-07-25 LAB — CYCLIC CITRUL PEPTIDE ANTIBODY, IGG: Cyclic Citrullin Peptide Ab: <16 UNITS

## 2021-08-18 NOTE — Progress Notes (Signed)
Office Visit Note  Patient: Katie Branch             Date of Birth: 15-Mar-1971           MRN: [id number]             PCP: Monico Blitz, MD Referring: Monico Blitz, MD Visit Date: 09/01/2021 Occupation: @GUAROCC @  Subjective:  Medication monitoring   History of Present Illness: Katie Branch is a 50 y.o. female with history of inflammatory polyarthritis, osteoarthritis, and fibromyalgia.  She was started on Plaquenil 200 mg 1 tablet by mouth twice daily Monday through Friday at her last office visit on 07/21/2021.  She has been tolerating Plaquenil without any side effects.  She states that she has started to notice improvement in her symptoms while taking Plaquenil.  She has had less joint pain and stiffness as well as improvement in her energy level.  She denies any joint swelling at this time.  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  She continues to experience intermittent myalgias and muscle tenderness due to fibromyalgia.  She remains on Cymbalta and Topamax as prescribed.  She states that her discomfort due to trochanteric bursitis has improved.  She is going for massage today which typically alleviates some of her myofascial pain.    Activities of Daily Living:  Patient reports morning stiffness for 30-60 minutes.   Patient Reports nocturnal pain.  Difficulty dressing/grooming: Denies Difficulty climbing stairs: Denies Difficulty getting out of chair: Denies Difficulty using hands for taps, buttons, cutlery, and/or writing: Denies  Review of Systems  Constitutional:  Positive for fatigue.  HENT:  Negative for mouth sores, mouth dryness and nose dryness.   Eyes:  Negative for pain, itching and dryness.  Respiratory:  Negative for shortness of breath and difficulty breathing.   Cardiovascular:  Negative for chest pain and palpitations.  Gastrointestinal:  Negative for blood in  stool, constipation and diarrhea.  Endocrine: Negative for increased urination.  Genitourinary:  Negative for difficulty urinating.  Musculoskeletal:  Positive for myalgias, morning stiffness, muscle tenderness and myalgias. Negative for joint pain, joint pain and joint swelling.  Skin:  Negative for color change, rash and redness.  Allergic/Immunologic: Negative for susceptible to infections.  Neurological:  Positive for headaches. Negative for dizziness, numbness, memory loss and weakness.  Hematological:  Positive for bruising/bleeding tendency.  Psychiatric/Behavioral:  Negative for confusion.    PMFS History:  Patient Active Problem List   Diagnosis Date Noted   Essential hypertension 01/04/2017   Gastroesophageal reflux disease without esophagitis 01/04/2017   Fibromyalgia 01/03/2017   Other fatigue 01/03/2017   Primary insomnia 01/03/2017   Plantar fasciitis 01/03/2017    Past Medical History:  Diagnosis Date   Fatigue    Fibromyalgia    GERD (gastroesophageal reflux disease)    Hypertension    Plantar fasciitis     Family History  Problem Relation Age of Onset   Cancer Mother        pancreatic    Heart disease Father    COPD Father    Past Surgical History:  Procedure Laterality Date   FLEXIBLE SIGMOIDOSCOPY  01/06/2021   Procedure: FLEXIBLE SIGMOIDOSCOPY;  Surgeon: Harvel Quale, MD;  [facility]: [facility];  Service: Gastroenterology;;   FOOT SURGERY Left 11/25/2019   Social History   Social History Narrative   Not on file   Immunization History  Administered Date(s) Administered   Influenza Inj Mdck Quad Pf 08/20/2019   Influenza-Unspecified 08/28/2019   Moderna Sars-Covid-2 Vaccination 01/23/2020, 02/25/2020     Objective: Vital Signs: BP 106/64 (BP Location: Left Arm, Patient Position: Sitting, Cuff Size: Large)   Pulse 74   Ht 5\' 5"  (1.651 m)   Wt 229 lb 12.8 oz (104.2 kg)   BMI 38.24 kg/m    Physical Exam Vitals and nursing note  reviewed.  Constitutional:      Appearance: She is well-developed.  HENT:     Head: Normocephalic and atraumatic.  Eyes:     Conjunctiva/sclera: Conjunctivae normal.  Pulmonary:     Effort: Pulmonary effort is normal.  Abdominal:     Palpations: Abdomen is soft.  Musculoskeletal:     Cervical back: Normal range of motion.  Skin:    General: Skin is warm and dry.     Capillary Refill: Capillary refill takes less than 2 seconds.  Neurological:     Mental Status: She is alert and oriented to person, place, and time.  Psychiatric:        Behavior: Behavior normal.     Musculoskeletal Exam: C-spine, thoracic spine, lumbar spine of the range of motion with no discomfort.  Shoulder joints, elbow joints, wrist joints, MCPs, PIPs, DIPs have good range of motion with discomfort.  Mild PIP and DIP thickening consistent with osteoarthritis of both hands.  Complete fist formation bilaterally.  Hip joints have good range of motion with no discomfort.  No tenderness over trochanteric bursa bilaterally.  Warmth of the right knee noted.  No knee joint effusion noted.  Ankle joints have good range of motion with no tenderness or joint swelling.  CDAI Exam: CDAI Score: -- Patient Global: --; Provider Global: -- Swollen: --; Tender: -- Joint Exam 09/01/2021   No joint exam has been documented for this visit   There is currently no information documented on the homunculus. Go to the Rheumatology activity and complete the homunculus joint exam.  Investigation: No additional findings.  Imaging: No results found.  Recent Labs: Lab Results  Component Value Date   WBC 9.0 07/21/2021   HGB 13.0 07/21/2021   PLT 300 07/21/2021   NA 138 07/21/2021   K 4.1 07/21/2021   CL 104 07/21/2021   CO2 26 07/21/2021   GLUCOSE 88 07/21/2021   BUN 12 07/21/2021   CREATININE 0.81 07/21/2021   BILITOT 0.4 07/21/2021   AST 17 07/21/2021   ALT 17 07/21/2021   PROT 7.1 07/21/2021   CALCIUM 9.6 07/21/2021     Speciality Comments: PLQ eye exam: 07/31/2021 normal @ [facility]. Dr. Lucious Groves. Follow up in 1 year.  Procedures:  No procedures performed Allergies: Penicillins   Assessment / Plan:     Visit Diagnoses: Inflammatory polyarthritis ([place]) - she had weak positive anti-CCP, negative RF and -14 3 3  eta in the past: She has persistent warmth upon palpation of the right knee but no effusion was noted.  Overall she has noticed clinical improvement since starting on Plaquenil after her last office visit on 07/21/2021.  She has been taking Plaquenil 200 mg 1 tablet by mouth twice daily Monday through Friday.  She has been tolerating Plaquenil without any side effects.  Her energy level has improved since starting on Plaquenil.  She has not noticed any joint swelling recently.  She will remain on Plaquenil as prescribed.  She was advised to notify us if she develops increased joint pain or  joint swelling.  She will follow-up in the office in 3 months to assess her fall response to Plaquenil.  High risk medication use - Plaquenil 200mg  by mouth twice daily Monday through Friday only.  She started Plaquenil after her last office visit on 07/21/2021.  She has been tolerating Plaquenil without any side effects.PLQ eye exam: 07/31/2021 normal @ [facility]. Dr. Lucious Groves. Follow up in 1 year. CBC and CMP within normal limits on 07/21/2021.  She is doing okay lumbar today.  Orders for CBC and CMP were released.  Her next lab work will be due in 3 months and every 5 months to monitor for drug toxicity. - Plan: CBC with Differential/Platelet, COMPLETE METABOLIC PANEL WITH GFR  Chronic left shoulder pain - Evaluated by Dr. Marlou Sa in April 2022.  Improved.  She has good range of motion of the left shoulder joint on examination today with no tenderness.  Trochanteric bursitis of both hips: Improved.  No tenderness over the trochanteric bursa on examination today.  Chronic pain of right knee - X-ray showed  mild to moderate osteoarthritis and mild chondromalacia patella.  Good range of motion of the right knee joint noted.  Warmth but no effusion noted.  She has noticed improvement in her pain and stiffness since starting on Plaquenil after her last office visit on 07/21/2021.  Primary osteoarthritis of right knee: She has persistent warmth of the right knee on examination but no effusion was noted.  Chronic SI joint pain - Left-Resolved. She had a cortisone injection performed on 05/12/20.  Fibromyalgia - She continues to experience intermittent myalgias and muscle tenderness due to fibromyalgia.  Her energy level has improved.  She has been experiencing less frequent nocturnal pain.  She we will be having a massage today.  Discussed the importance of regular exercise and good sleep hygiene.  We also discussed the importance of stress management.  Other fatigue: Her energy level has improved since starting on Plaquenil.  Discussed the importance of regular exercise and good sleep hygiene.  Primary insomnia: She takes Topamax 25 mg at bedtime as prescribed.  Discussed the importance of good sleep hygiene.  Other medical conditions are listed as follows:  Gastroesophageal reflux disease without esophagitis  Essential hypertension: Blood pressure was 106/64 today in the office.  Orders: Orders Placed This Encounter  Procedures   CBC with Differential/Platelet   COMPLETE METABOLIC PANEL WITH GFR   No orders of the defined types were placed in this encounter.    Follow-Up Instructions: Return in about 3 months (around 12/02/2021) for Inflammatory polyarthritis, Osteoarthritis, Fibromyalgia.   Ofilia Neas, PA-C  Note - This record has been created using Dragon software.  Chart creation errors have been sought, but may not always  have been located. Such creation errors do not reflect on  the standard of medical care.

## 2021-09-01 ENCOUNTER — Other Ambulatory Visit: Payer: Self-pay

## 2021-09-01 ENCOUNTER — Encounter: Payer: Self-pay | Admitting: Physician Assistant

## 2021-09-01 ENCOUNTER — Ambulatory Visit: Payer: 59 | Admitting: Physician Assistant

## 2021-09-01 VITALS — BP 106/64 | HR 74 | Ht 65.0 in | Wt 229.8 lb

## 2021-09-01 DIAGNOSIS — M064 Inflammatory polyarthropathy: Secondary | ICD-10-CM

## 2021-09-01 DIAGNOSIS — K219 Gastro-esophageal reflux disease without esophagitis: Secondary | ICD-10-CM

## 2021-09-01 DIAGNOSIS — G8929 Other chronic pain: Secondary | ICD-10-CM

## 2021-09-01 DIAGNOSIS — M25561 Pain in right knee: Secondary | ICD-10-CM

## 2021-09-01 DIAGNOSIS — M7061 Trochanteric bursitis, right hip: Secondary | ICD-10-CM | POA: Diagnosis not present

## 2021-09-01 DIAGNOSIS — I10 Essential (primary) hypertension: Secondary | ICD-10-CM

## 2021-09-01 DIAGNOSIS — M25512 Pain in left shoulder: Secondary | ICD-10-CM

## 2021-09-01 DIAGNOSIS — M7062 Trochanteric bursitis, left hip: Secondary | ICD-10-CM

## 2021-09-01 DIAGNOSIS — Z79899 Other long term (current) drug therapy: Secondary | ICD-10-CM

## 2021-09-01 DIAGNOSIS — M533 Sacrococcygeal disorders, not elsewhere classified: Secondary | ICD-10-CM

## 2021-09-01 DIAGNOSIS — M797 Fibromyalgia: Secondary | ICD-10-CM

## 2021-09-01 DIAGNOSIS — M1711 Unilateral primary osteoarthritis, right knee: Secondary | ICD-10-CM

## 2021-09-01 DIAGNOSIS — F5101 Primary insomnia: Secondary | ICD-10-CM

## 2021-09-01 DIAGNOSIS — R5383 Other fatigue: Secondary | ICD-10-CM

## 2021-09-02 LAB — CBC WITH DIFFERENTIAL/PLATELET
Absolute Monocytes: 754 cells/uL (ref 200–950)
Basophils Absolute: 49 cells/uL (ref 0–200)
Basophils Relative: 0.6 %
Eosinophils Absolute: 303 cells/uL (ref 15–500)
Eosinophils Relative: 3.7 %
HCT: 39.7 % (ref 35.0–45.0)
Hemoglobin: 13 g/dL (ref 11.7–15.5)
Lymphs Abs: 1730 cells/uL (ref 850–3900)
MCH: 29.7 pg (ref 27.0–33.0)
MCHC: 32.7 g/dL (ref 32.0–36.0)
MCV: 90.6 fL (ref 80.0–100.0)
MPV: 9.7 fL (ref 7.5–12.5)
Monocytes Relative: 9.2 %
Neutro Abs: 5363 cells/uL (ref 1500–7800)
Neutrophils Relative %: 65.4 %
Platelets: 281 10*3/uL (ref 140–400)
RBC: 4.38 10*6/uL (ref 3.80–5.10)
RDW: 12.9 % (ref 11.0–15.0)
Total Lymphocyte: 21.1 %
WBC: 8.2 10*3/uL (ref 3.8–10.8)

## 2021-09-02 LAB — COMPLETE METABOLIC PANEL WITH GFR
AG Ratio: 1.7 (calc) (ref 1.0–2.5)
ALT: 15 U/L (ref 6–29)
AST: 16 U/L (ref 10–35)
Albumin: 4.5 g/dL (ref 3.6–5.1)
Alkaline phosphatase (APISO): 82 U/L (ref 37–153)
BUN: 12 mg/dL (ref 7–25)
CO2: 27 mmol/L (ref 20–32)
Calcium: 9.5 mg/dL (ref 8.6–10.4)
Chloride: 105 mmol/L (ref 98–110)
Creat: 1.01 mg/dL (ref 0.50–1.03)
Globulin: 2.6 g/dL (calc) (ref 1.9–3.7)
Glucose, Bld: 88 mg/dL (ref 65–99)
Potassium: 4.3 mmol/L (ref 3.5–5.3)
Sodium: 139 mmol/L (ref 135–146)
Total Bilirubin: 0.4 mg/dL (ref 0.2–1.2)
Total Protein: 7.1 g/dL (ref 6.1–8.1)
eGFR: 67 mL/min/{1.73_m2} (ref >=60)

## 2021-09-02 NOTE — Progress Notes (Signed)
CBC and CMP WNL

## 2021-09-13 ENCOUNTER — Other Ambulatory Visit: Payer: Self-pay | Admitting: Physician Assistant

## 2021-09-14 NOTE — Telephone Encounter (Signed)
Next Visit: 12/01/2021  Last Visit: 09/01/2021  Last Fill: 12/18/2020  Dx: Fibromyalgia  Current Dose per office note on 09/01/2021: not discussed  Okay to refill Cymbalta?

## 2021-09-29 ENCOUNTER — Other Ambulatory Visit: Payer: Self-pay | Admitting: Internal Medicine

## 2021-09-29 DIAGNOSIS — Z139 Encounter for screening, unspecified: Secondary | ICD-10-CM

## 2021-10-04 ENCOUNTER — Ambulatory Visit
Admission: RE | Admit: 2021-10-04 | Discharge: 2021-10-04 | Disposition: A | Discharge status: Home / Self Care | Payer: 59 | Source: Ambulatory Visit | Attending: Internal Medicine | Admitting: Internal Medicine

## 2021-10-04 DIAGNOSIS — Z139 Encounter for screening, unspecified: Secondary | ICD-10-CM

## 2021-10-26 ENCOUNTER — Other Ambulatory Visit: Payer: Self-pay | Admitting: Rheumatology

## 2021-10-27 NOTE — Telephone Encounter (Signed)
Next Visit: 12/01/2021  Last Visit: 09/01/2021  Labs: 09/01/2021 CBC and CMP WNL  Eye exam: 07/31/2021 normal   Current Dose per office note 09/01/2021: Plaquenil 200mg  by mouth twice daily Monday through Friday only.   QD:IYMEBRAXENMM polyarthritis   Last Fill: 07/21/2021  Okay to refill Plaquenil?

## 2021-11-23 NOTE — Progress Notes (Signed)
Office Visit Note  Patient: Katie Branch             Date of Birth: June 28, 1971           MRN: [id number]             PCP: Monico Blitz, MD Referring: Monico Blitz, MD Visit Date: 12/01/2021 Occupation: @GUAROCC @  Subjective:  Thoracic pain  History of Present Illness: Katie Branch is a 51 y.o. female with a history of chronic inflammatory arthritis, fibromyalgia and osteoarthritis.  She states about 3 weeks ago she started having pain in the upper back.  She was seen by her PCP.  She states she was given a prescription of muscle relaxer.  She states that she took the muscle relaxer but her symptoms did not improve.  She states the symptoms are getting worse with increased pain in the thoracic region.  She states the pain gets worse when she is sitting.  She drives truck which exacerbates the pain.  She notices some relief by stretching her back.  She states the pain radiates into her right arm.  She is off-and-on discomfort in her left shoulder.  She had cortisone injection by Dr. Marlou Sa in the past and he thought that it was possible superior labrum.  Trochanteric bursitis has improved.  She is having some discomfort in her knee joint off and on.  SI joint pain is better.  She states the fibromyalgia symptoms are tolerable.  She denies any  joint swelling.  Activities of Daily Living:  Patient reports morning stiffness for 0 minute.   Patient Reports nocturnal pain.  Both shoulders Difficulty dressing/grooming: Denies Difficulty climbing stairs: Denies Difficulty getting out of chair: Denies Difficulty using hands for taps, buttons, cutlery, and/or writing: Denies  Review of Systems  Constitutional:  Positive for fatigue.  HENT:  Negative for mouth sores, mouth dryness and nose dryness.   Eyes:  Negative for dryness.  Respiratory:  Negative for shortness of breath and difficulty breathing.   Cardiovascular:  Negative for palpitations.  Gastrointestinal:  Negative for blood in stool,  constipation and diarrhea.  Endocrine: Negative for heat intolerance and excessive hunger.  Genitourinary:  Negative for involuntary urination and nocturia.  Musculoskeletal:  Positive for joint pain, joint pain, myalgias and myalgias.  Skin:  Negative for color change, rash and sensitivity to sunlight.  Allergic/Immunologic: Negative for susceptible to infections.  Neurological:  Negative for dizziness and headaches.  Hematological:  Negative for swollen glands.  Psychiatric/Behavioral:  Positive for sleep disturbance. Negative for depressed mood. The patient is not nervous/anxious.    PMFS History:  Patient Active Problem List   Diagnosis Date Noted   Essential hypertension 01/04/2017   Gastroesophageal reflux disease without esophagitis 01/04/2017   Fibromyalgia 01/03/2017   Other fatigue 01/03/2017   Primary insomnia 01/03/2017   Plantar fasciitis 01/03/2017    Past Medical History:  Diagnosis Date   Fatigue    Fibromyalgia    GERD (gastroesophageal reflux disease)    Hypertension    Plantar fasciitis     Family History  Problem Relation Age of Onset   Cancer Mother        pancreatic    Heart disease Father    COPD Father    Breast cancer Neg Hx    Past Surgical History:  Procedure Laterality Date   FLEXIBLE SIGMOIDOSCOPY  01/06/2021   Procedure: FLEXIBLE SIGMOIDOSCOPY;  Surgeon: Harvel Quale, MD;  Location: [facility];  Service: Gastroenterology;;  FOOT SURGERY Left 11/25/2019   Social History   Social History Narrative   Not on file   Immunization History  Administered Date(s) Administered   Influenza Inj Mdck Quad Pf 08/20/2019   Influenza-Unspecified 08/28/2019   Moderna Sars-Covid-2 Vaccination 01/23/2020, 02/25/2020     Objective: Vital Signs: BP 140/76 (BP Location: Left Arm, Patient Position: Sitting, Cuff Size: Large)    Pulse 77    Ht 5\' 5"  (1.651 m)    Wt 226 lb (102.5 kg)    BMI 37.61 kg/m    Physical Exam Vitals and nursing  note reviewed.  Constitutional:      Appearance: She is well-developed.  HENT:     Head: Normocephalic and atraumatic.  Eyes:     Conjunctiva/sclera: Conjunctivae normal.  Cardiovascular:     Rate and Rhythm: Normal rate and regular rhythm.     Heart sounds: Normal heart sounds.  Pulmonary:     Effort: Pulmonary effort is normal.     Breath sounds: Normal breath sounds.  Abdominal:     General: Bowel sounds are normal.     Palpations: Abdomen is soft.  Musculoskeletal:     Cervical back: Normal range of motion.  Lymphadenopathy:     Cervical: No cervical adenopathy.  Skin:    General: Skin is warm and dry.     Capillary Refill: Capillary refill takes less than 2 seconds.  Neurological:     Mental Status: She is alert and oriented to person, place, and time.  Psychiatric:        Behavior: Behavior normal.     Musculoskeletal Exam: C-spine was in good range of motion.  She has some tenderness over T2 vertebrae.  Lumbar spine was in good range of motion with no discomfort.  Shoulder joints are in good range of motion with some discomfort in left shoulder joint.  Elbow joints, wrist joints, MCPs PIPs and DIPs with good range of motion with no synovitis.  PIP and DIP thickening was noted.  Hip joints and knee joints in good range of motion with no synovitis.  There was no tenderness over ankles or MTPs.  CDAI Exam: CDAI Score: 0.6  Patient Global: 3 mm; Provider Global: 3 mm Swollen: 0 ; Tender: 1  Joint Exam 12/01/2021      Right  Left  Thoracic Spine   Tender        Investigation: No additional findings.  Imaging: XR Thoracic Spine 2 View  Result Date: 12/01/2021 Anterior osteophytes were noted.  Mild multilevel disc space narrowing was noted.  No significant narrowing was noted between T9 and T10. 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   Recent Labs: Lab Results  Component Value Date   WBC 8.2 09/01/2021   HGB  13.0 09/01/2021   PLT 281 09/01/2021   NA 139 09/01/2021   K 4.3 09/01/2021   CL 105 09/01/2021   CO2 27 09/01/2021   GLUCOSE 88 09/01/2021   BUN 12 09/01/2021   CREATININE 1.01 09/01/2021   BILITOT 0.4 09/01/2021   AST 16 09/01/2021   ALT 15 09/01/2021   PROT 7.1 09/01/2021   CALCIUM 9.5 09/01/2021    Speciality Comments: PLQ eye exam: 07/31/2021 normal @ [facility]. Dr. Lucious Groves. Follow up in 1 year.  Procedures:  No procedures performed Allergies: Penicillins   Assessment / Plan:     Visit Diagnoses: Inflammatory polyarthritis ([place]) - she had weak positive anti-CCP, negative RF and -14  3 3 eta in the past: She had no synovitis on examination.  She denies any history of recent joint pain or joint swelling.  She was placed on hydroxychloroquine in September 2022 which she is tolerating well and has noticed improvement in her joint symptoms.  High risk medication use - Plaquenil 200mg  by mouth twice daily Monday through Friday only. PLQ eye exam: 07/31/2021.  Labs on September 01, 2021 CBC with differential and CMP with GFR were normal.  She is advised to get labs every 5 months.  Eye examination will be on a yearly basis.  Information regarding mineralizations was placed in the AVS.  Pain in thoracic spine -she is having increased discomfort in her upper thoracic spine.  She states that the symptoms get worse when she is driving the truck or sitting for a long time.  She states that stretching helps.  She also goes for massage therapy. That she did not get any relief from massage therapy.  She was seen by her PCP and was given cyclobenzaprine.  She states she takes it on a as needed basis as this causes drowsiness plan: XR Thoracic Spine 2 View.  X-ray of the thoracic spine showed multilevel spondylosis with osteophytes.  Most significant narrowing was noted between T9 and T10.  X-ray findings were discussed with the patient.  I will refer her to physical therapy.  Chronic left  shoulder pain - Evaluated by Dr. Marlou Sa in April 2022.  She was evaluated by Dr. Marlou Sa and had a cortisone injection.  She has intermittent pain.  Trochanteric bursitis of both hips-she has off-and-on discomfort in her trochanteric area.  Chronic pain of right knee - X-ray showed mild to moderate osteoarthritis and mild chondromalacia patella.  She has some discomfort in her knee joint in the past but not has been painful recently.  Primary osteoarthritis of right knee  Chronic SI joint pain - Left-Resolved. She had a cortisone injection performed on 05/12/20.  Fibromyalgia-she states her fibromyalgia symptoms have improved.  Other fatigue-related to fibromyalgia.  Primary insomnia -she has chronic insomnia.  She is on Topamax 25 mg at bedtime as prescribed.  Gastroesophageal reflux disease without esophagitis  Essential hypertension-blood pressure was mildly elevated today.  Orders: Orders Placed This Encounter  Procedures   XR Thoracic Spine 2 View   No orders of the defined types were placed in this encounter.    Follow-Up Instructions: Return in about 5 months (around 04/30/2022) for Inflammatory arthritis, Osteoarthritis.   Bo Merino, MD  Note - This record has been created using Editor, commissioning.  Chart creation errors have been sought, but may not always  have been located. Such creation errors do not reflect on  the standard of medical care.

## 2021-12-01 ENCOUNTER — Ambulatory Visit: Payer: 59 | Admitting: Rheumatology

## 2021-12-01 ENCOUNTER — Encounter: Payer: Self-pay | Admitting: Rheumatology

## 2021-12-01 ENCOUNTER — Ambulatory Visit: Payer: Self-pay

## 2021-12-01 ENCOUNTER — Other Ambulatory Visit: Payer: Self-pay

## 2021-12-01 VITALS — BP 140/76 | HR 77 | Ht 65.0 in | Wt 226.0 lb

## 2021-12-01 DIAGNOSIS — M064 Inflammatory polyarthropathy: Secondary | ICD-10-CM | POA: Diagnosis not present

## 2021-12-01 DIAGNOSIS — M546 Pain in thoracic spine: Secondary | ICD-10-CM | POA: Diagnosis not present

## 2021-12-01 DIAGNOSIS — Z79899 Other long term (current) drug therapy: Secondary | ICD-10-CM | POA: Diagnosis not present

## 2021-12-01 DIAGNOSIS — M7061 Trochanteric bursitis, right hip: Secondary | ICD-10-CM

## 2021-12-01 DIAGNOSIS — R5383 Other fatigue: Secondary | ICD-10-CM

## 2021-12-01 DIAGNOSIS — M797 Fibromyalgia: Secondary | ICD-10-CM

## 2021-12-01 DIAGNOSIS — I10 Essential (primary) hypertension: Secondary | ICD-10-CM

## 2021-12-01 DIAGNOSIS — F5101 Primary insomnia: Secondary | ICD-10-CM

## 2021-12-01 DIAGNOSIS — M25512 Pain in left shoulder: Secondary | ICD-10-CM | POA: Diagnosis not present

## 2021-12-01 DIAGNOSIS — M533 Sacrococcygeal disorders, not elsewhere classified: Secondary | ICD-10-CM

## 2021-12-01 DIAGNOSIS — M25561 Pain in right knee: Secondary | ICD-10-CM

## 2021-12-01 DIAGNOSIS — M7062 Trochanteric bursitis, left hip: Secondary | ICD-10-CM

## 2021-12-01 DIAGNOSIS — M1711 Unilateral primary osteoarthritis, right knee: Secondary | ICD-10-CM

## 2021-12-01 DIAGNOSIS — K219 Gastro-esophageal reflux disease without esophagitis: Secondary | ICD-10-CM

## 2021-12-01 DIAGNOSIS — G8929 Other chronic pain: Secondary | ICD-10-CM

## 2021-12-01 NOTE — Patient Instructions (Addendum)
Standing Labs We placed an order today for your standing lab work.   Please have your standing labs drawn in April and every 5 months.  If possible, please have your labs drawn 2 weeks prior to your appointment so that the provider can discuss your results at your appointment.  Please note that you may see your imaging and lab results in [place] before we have reviewed them. We may be awaiting multiple results to interpret others before contacting you. Please allow our office up to 72 hours to thoroughly review all of the results before contacting the office for clarification of your results.  We have open lab daily: Monday through Thursday from 1:30-4:30 PM and Friday from 1:30-4:00 PM at the office of Dr. Bo Merino, [facility].   Please be advised, all patients with office appointments requiring lab work will take precedent over walk-in lab work.  If possible, please come for your lab work on Monday and Friday afternoons, as you may experience shorter wait times. The office is located at [address] [place], [place], [place] [address] No appointment is necessary.   Labs are drawn by [facility]. Please bring your co-pay at the time of your lab draw.  You may receive a bill from [facility] for your lab work.  Please note if you are on Hydroxychloroquine and and an order has been placed for a Hydroxychloroquine level, you will need to have it drawn 4 hours or more after your last dose.  If you wish to have your labs drawn at another location, please call the office 24 hours in advance to send orders.  If you have any questions regarding directions or hours of operation,  please call [phone].   As a reminder, please drink plenty of water prior to coming for your lab work. Thanks!   Vaccines You are taking a medication(s) that can suppress your immune system.  The following immunizations are recommended: Flu annually Covid-19  Td/Tdap (tetanus, diphtheria,  pertussis) every 10 years Pneumonia (Prevnar 15 then Pneumovax 23 at least 1 year apart.  Alternatively, can take Prevnar 20 without needing additional dose) Shingrix: 2 doses from 4 weeks to 6 months apart  Please check with your PCP to make sure you are up to date.    Exercises The following exercises strengthen the muscles that help to support the trunk (torso) and back. They also help to keep the lower back flexible. Doing these exercises can help to prevent or lessen existing low back pain. If you have back pain or discomfort, try doing these exercises 2-3 times each day or as told by your health care provider. As your pain improves, do them once each day, but increase the number of times that you repeat the steps for each exercise (do more repetitions). To prevent the recurrence of back pain, continue to do these exercises once each day or as told by your health care provider. Do exercises exactly as told by your health care provider and adjust them as directed. It is normal to feel mild stretching, pulling, tightness, or discomfort as you do these exercises, but you should stop right away if you feel sudden pain or your pain gets worse. Exercises Single knee to chest Repeat these steps 3-5 times for each leg: Lie on your back on a firm bed or the floor with your legs extended. Bring one knee to your chest. Your other leg should stay extended and in contact with the floor. Hold your knee in place  by grabbing your knee or thigh with both hands and hold. Pull on your knee until you feel a gentle stretch in your lower back or buttocks. Hold the stretch for 10-30 seconds. Slowly release and straighten your leg.  Pelvic tilt Repeat these steps 5-10 times: Lie on your back on a firm bed or the floor with your legs extended. Bend your knees so they are pointing toward the ceiling and your feet are flat on the floor. Tighten your lower abdominal muscles to press your lower back against the  floor. This motion will tilt your pelvis so your tailbone points up toward the ceiling instead of pointing to your feet or the floor. With gentle tension and even breathing, hold this position for 5-10 seconds.  Cat-cow Repeat these steps until your lower back becomes more flexible: Get into a hands-and-knees position on a firm bed or the floor. Keep your hands under your shoulders, and keep your knees under your hips. You may place padding under your knees for comfort. Let your head hang down toward your chest. Contract your abdominal muscles and point your tailbone toward the floor so your lower back becomes rounded like the back of a cat. Hold this position for 5 seconds. Slowly lift your head, let your abdominal muscles relax, and point your tailbone up toward the ceiling so your back forms a sagging arch like the back of a cow. Hold this position for 5 seconds.  Press-ups Repeat these steps 5-10 times: Lie on your abdomen (face-down) on a firm bed or the floor. Place your palms near your head, about shoulder-width apart. Keeping your back as relaxed as possible and keeping your hips on the floor, slowly straighten your arms to raise the top half of your body and lift your shoulders. Do not use your back muscles to raise your upper torso. You may adjust the placement of your hands to make yourself more comfortable. Hold this position for 5 seconds while you keep your back relaxed. Slowly return to lying flat on the floor.  Bridges Repeat these steps 10 times: Lie on your back on a firm bed or the floor. Bend your knees so they are pointing toward the ceiling and your feet are flat on the floor. Your arms should be flat at your sides, next to your body. Tighten your buttocks muscles and lift your buttocks off the floor until your waist is at almost the same height as your knees. You should feel the muscles working in your buttocks and the back of your thighs. If you do not feel these  muscles, slide your feet 1-2 inches (2.5-5 cm) farther away from your buttocks. Hold this position for 3-5 seconds. Slowly lower your hips to the starting position, and allow your buttocks muscles to relax completely. If this exercise is too easy, try doing it with your arms crossed over your chest. Abdominal crunches Repeat these steps 5-10 times: Lie on your back on a firm bed or the floor with your legs extended. Bend your knees so they are pointing toward the ceiling and your feet are flat on the floor. Cross your arms over your chest. Tip your chin slightly toward your chest without bending your neck. Tighten your abdominal muscles and slowly raise your torso high enough to lift your shoulder blades a tiny bit off the floor. Avoid raising your torso higher than that because it can put too much stress on your lower back and does not help to strengthen your abdominal muscles.  Slowly return to your starting position.  Back lifts Repeat these steps 5-10 times: Lie on your abdomen (face-down) with your arms at your sides, and rest your forehead on the floor. Tighten the muscles in your legs and your buttocks. Slowly lift your chest off the floor while you keep your hips pressed to the floor. Keep the back of your head in line with the curve in your back. Your eyes should be looking at the floor. Hold this position for 3-5 seconds. Slowly return to your starting position.  Contact a health care provider if: Your back pain or discomfort gets much worse when you do an exercise. Your worsening back pain or discomfort does not lessen within 2 hours after you exercise. If you have any of these problems, stop doing these exercises right away. Do not do them again unless your health care provider says that you can. Get help right away if: You develop sudden, severe back pain. If this happens, stop doing the exercises right away. Do not do them again unless your health care provider says that you  can. This information is not intended to replace advice given to you by your health care provider. Make sure you discuss any questions you have with your health care provider. Document Revised: 04/06/2021 Document Reviewed: 12/23/2020 Elsevier Patient Education  [place].

## 2021-12-10 ENCOUNTER — Other Ambulatory Visit: Payer: Self-pay | Admitting: *Deleted

## 2021-12-10 MED ORDER — DULOXETINE HCL 60 MG PO CPEP
60.0000 mg | ORAL_CAPSULE | Freq: Every day | ORAL | 2 refills | Status: DC
Start: 2021-12-10 — End: 2022-02-07

## 2021-12-10 NOTE — Telephone Encounter (Signed)
Refill request received via fax  Next Visit: 03/01/2022  Last Visit: 12/01/2021  Last Fill: 09/14/2021  Dx: Fibromyalgia  Current Dose per office note on 12/01/2021: not discussed  Okay to refill Cymbalta?

## 2021-12-27 ENCOUNTER — Telehealth: Payer: Self-pay | Admitting: *Deleted

## 2021-12-27 ENCOUNTER — Other Ambulatory Visit: Payer: Self-pay

## 2021-12-27 MED ORDER — HYDROXYCHLOROQUINE SULFATE 200 MG PO TABS: ORAL_TABLET | ORAL | 0 refills | Status: DC

## 2021-12-27 NOTE — Telephone Encounter (Signed)
Patient called to let Seth Bake know that her Plaquenil refill needs to be sent to [facility].   ?

## 2021-12-27 NOTE — Telephone Encounter (Signed)
Received a fax from [facility] requesting a refill on PLQ. Patient last filled prescription with [facility]. Need to verify with patient she is requesting the switch.  ?

## 2021-12-27 NOTE — Telephone Encounter (Signed)
Next Visit: 03/01/2022 ? ?Last Visit: 12/01/2021 ? ?Labs: 09/01/2021 CBC and CMP WNL ? ?Eye exam: 07/31/2021  ? ?Current Dose per office note on 12/01/2021: Plaquenil '200mg'$  by mouth twice daily Monday through Friday only. ? ?DX: Inflammatory polyarthritis  ? ?Last Fill: 10/27/2021 ? ?Okay to refill Plaquenil?  ?

## 2022-02-06 ENCOUNTER — Other Ambulatory Visit: Payer: Self-pay | Admitting: Physician Assistant

## 2022-02-07 NOTE — Telephone Encounter (Signed)
Next Visit: 03/01/2022 ?  ?Last Visit: 12/01/2021 ?  ?Last Fill: 12/10/2021 ?  ?Dx: Fibromyalgia ?  ?Current Dose per office note on 12/01/2021: not discussed ?  ?Okay to refill Cymbalta?   ?

## 2022-02-15 NOTE — Progress Notes (Signed)
? ?Office Visit Note ? ?Patient: Katie Branch             ?Date of Birth: 05-10-1971           ?MRN: [id number]             ?PCP: Monico Blitz, MD ?Referring: Monico Blitz, MD ?Visit Date: 03/01/2022 ?Occupation: '@GUAROCC'$ @ ? ?Subjective:  ?Occasional arthralgias  ? ?History of Present Illness: Katie Branch is a 51 y.o. female with history of inflammatory arthritis, fibromyalgia, and osteoarthritis. She is taking plaquenil 200 mg 1 tablet by mouth twice daily Monday through Friday.  Patient is tolerating Plaquenil without any side effects and has not missed any doses recently.  She states that she recently changed positions at her job which has improved her generalized myalgias and arthralgias significantly.  She states that she had a massage on Saturday and her massage therapist noticed a significant improvement in her muscle tightness and tension.  She continues to have occasional discomfort in her right knee joint but denies any joint swelling at this time.  She states that her discomfort due to trochanteric bursitis has improved.  She has been more active with the change in position at work which has been improving her pain and stiffness throughout the day but she has had some increased arthralgias and joint stiffness at night. ? ? ? ?Activities of Daily Living:  ?Patient reports morning stiffness for 30-45 minutes.   ?Patient Reports nocturnal pain.  ?Difficulty dressing/grooming: Denies ?Difficulty climbing stairs: Denies ?Difficulty getting out of chair: Denies ?Difficulty using hands for taps, buttons, cutlery, and/or writing: Denies ? ?Review of Systems  ?Constitutional:  Negative for fatigue.  ?HENT:  Negative for mouth sores, mouth dryness and nose dryness.   ?Eyes:  Negative for pain, itching and dryness.  ?Respiratory:  Negative for shortness of breath and difficulty breathing.   ?Cardiovascular:  Negative for chest pain and palpitations.  ?Gastrointestinal:  Negative for blood in stool, constipation  and diarrhea.  ?Endocrine: Negative for increased urination.  ?Genitourinary:  Negative for difficulty urinating.  ?Musculoskeletal:  Positive for joint pain, joint pain, joint swelling, myalgias, morning stiffness, muscle tenderness and myalgias.  ?Skin:  Negative for color change, rash and redness.  ?Allergic/Immunologic: Negative for susceptible to infections.  ?Neurological:  Negative for dizziness, numbness, headaches, memory loss and weakness.  ?Hematological:  Positive for bruising/bleeding tendency.  ?Psychiatric/Behavioral:  Negative for confusion.   ? ?PMFS History:  ?Patient Active Problem List  ? Diagnosis Date Noted  ? Essential hypertension 01/04/2017  ? Gastroesophageal reflux disease without esophagitis 01/04/2017  ? Fibromyalgia 01/03/2017  ? Other fatigue 01/03/2017  ? 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 Plantar fasciitis 01/03/2017  ?  ?Past Medical History:  ?Diagnosis Date  ? Fatigue   ? Fibromyalgia   ? GERD (gastroesophageal reflux disease)   ? Hypertension   ? Plantar fasciitis   ?  ?Family History  ?Problem Relation Age of Onset  ? Cancer Mother   ?     pancreatic   ? Heart disease Father   ? COPD Father   ? Breast cancer Neg Hx   ? ?Past Surgical History:  ?Procedure Laterality Date  ? FLEXIBLE SIGMOIDOSCOPY  01/06/2021  ? Procedure: FLEXIBLE SIGMOIDOSCOPY;  Surgeon: Montez Morita, Quillian Quince, MD;  Location: [facility];  Service: Gastroenterology;;  ? FOOT SURGERY Left 11/25/2019  ? ?Social History  ? ?Social History Narrative  ? Not on file  ? ?Immunization History  ?Administered Date(s) Administered  ?  Influenza Inj Mdck Quad Pf 08/20/2019  ? Influenza-Unspecified 08/28/2019  ? Moderna Sars-Covid-2 Vaccination 01/23/2020, 02/25/2020  ?  ? ?Objective: ?Vital Signs: BP 126/81 (BP Location: Left Arm, Patient Position: Sitting, Cuff Size: Large)   Pulse 67   Ht '5\' 5"'$  (1.651 m)   Wt 228 lb 3.2 oz (103.5 kg)   BMI 37.97 kg/m?   ? ?Physical Exam ?Vitals and nursing note reviewed.   ?Constitutional:   ?   Appearance: She is well-developed.  ?HENT:  ?   Head: Normocephalic and atraumatic.  ?Eyes:  ?   Conjunctiva/sclera: Conjunctivae normal.  ?Cardiovascular:  ?   Rate and Rhythm: Normal rate and regular rhythm.  ?   Heart sounds: Normal heart sounds.  ?Pulmonary:  ?   Effort: Pulmonary effort is normal.  ?   Breath sounds: Normal breath sounds.  ?Abdominal:  ?   General: Bowel sounds are normal.  ?   Palpations: Abdomen is soft.  ?Musculoskeletal:  ?   Cervical back: Normal range of motion.  ?Skin: ?   General: Skin is warm and dry.  ?   Capillary Refill: Capillary refill takes less than 2 seconds.  ?Neurological:  ?   Mental Status: She is alert and oriented to person, place, and time.  ?Psychiatric:     ?   Behavior: Behavior normal.  ?  ? ?Musculoskeletal Exam: C-spine, thoracic spine, lumbar spine have good range of motion.  Some midline spinal tenderness in the cervical region.  No SI joint tenderness upon palpation.  Shoulder joints, elbow joints, wrist joints, MCPs, PIPs, DIPs have good range of motion with no synovitis.  Complete fist formation bilaterally.  Hip joints have good range of motion with no groin pain.  Some tenderness palpation over trochanteric bursa bilaterally.  Knee joints have good range of motion with mild warmth in the right knee.  Ankle joints have good range of motion with no tenderness or joint swelling. ? ?CDAI Exam: ?CDAI Score: 0.4  ?Patient Global: 2 mm; Provider Global: 2 mm ?Swollen: 0 ; Tender: 0  ?Joint Exam 03/01/2022  ? ?No joint exam has been documented for this visit  ? ?There is currently no information documented on the homunculus. Go to the Rheumatology activity and complete the homunculus joint exam. ? ?Investigation: ?No additional findings. ? ?Imaging: ?No results found. ? ?Recent Labs: ?Lab Results  ?Component Value Date  ? WBC 8.2 09/01/2021  ? HGB 13.0 09/01/2021  ? PLT 281 09/01/2021  ? NA 139 09/01/2021  ? K 4.3 09/01/2021  ? CL 105  09/01/2021  ? CO2 27 09/01/2021  ? GLUCOSE 88 09/01/2021  ? BUN 12 09/01/2021  ? CREATININE 1.01 09/01/2021  ? BILITOT 0.4 09/01/2021  ? AST 16 09/01/2021  ? ALT 15 09/01/2021  ? PROT 7.1 09/01/2021  ? CALCIUM 9.5 09/01/2021  ? ? ?Speciality Comments: PLQ eye exam: 07/31/2021 normal @ [facility]. Dr. Lucious Groves. Follow up in 1 year. ? ?Procedures:  ?No procedures performed ?Allergies: Penicillins  ? ? ? ?Assessment / Plan:     ?Visit Diagnoses: Inflammatory polyarthritis ([place]) - weak positive anti-CCP, negative RF and -'14 3 3 '$ eta in the past: She has no synovitis on examination today.  She has mild warmth but no effusion of the right knee joint on examination today.  She has been able to increase her activity level without difficulty.  She is clinically doing well taking Plaquenil 200 mg 1 tablet by mouth twice daily Monday through Friday.  She continues to tolerate Plaquenil without any side effects and has not missed any doses recently.  She will remain on Plaquenil as monotherapy.  She was advised to notify us if she develops increased joint pain or joint swelling.  She will follow-up in the office in 5 months or sooner if needed. ? ?High risk medication use - Plaquenil '200mg'$  by mouth twice daily Monday through Friday only.  Initially started on Plaquenil in September 2022. ?CBC and CMP WNL on 09/01/21.  She is due to update lab work today.  Orders released.  Her next lab work will be due in 5 months to monitor for drug toxicity.   - Plan: COMPLETE METABOLIC PANEL WITH GFR, CBC with Differential/Platelet ?PLQ eye exam: 07/31/2021 normal @ [facility]. Dr. Lucious Groves. Follow up in 1 year. ? ?Pain in thoracic spine: Improved.  X-rays of the thoracic spine at her last office visit revealed multilevel spondylosis with osteophytes.  No significant narrowing was noted between T9 and T10.  She has no midline spinal tenderness in the thoracic region.  No trapezius muscle tension or tenderness at this time.  She  had a massage on Saturday which alleviated her symptoms. ? ?Chronic left shoulder pain - Evaluated by Dr. Marlou Sa in April 2022.  Improved.  She has good range of motion of the left shoulder with no discomfort at this time. ? ?[place]

## 2022-02-27 ENCOUNTER — Other Ambulatory Visit: Payer: Self-pay | Admitting: Physician Assistant

## 2022-02-28 NOTE — Telephone Encounter (Signed)
Next Visit: 03/01/2022 ? ?Last Visit: 12/01/2021 ? ?Labs: 09/01/2021, CBC and CMP WNL ? ?Eye exam: 07/31/2021  ? ?Current Dose per office note 12/01/2021: Plaquenil '200mg'$  by mouth twice daily Monday through Friday only. ? ?DX: Inflammatory polyarthritis  ? ?Last Fill: 12/27/2021 ? ?Okay to refill Plaquenil? ? ?

## 2022-03-01 ENCOUNTER — Ambulatory Visit: Payer: 59 | Admitting: Physician Assistant

## 2022-03-01 ENCOUNTER — Encounter: Payer: Self-pay | Admitting: Physician Assistant

## 2022-03-01 VITALS — BP 126/81 | HR 67 | Ht 65.0 in | Wt 228.2 lb

## 2022-03-01 DIAGNOSIS — M546 Pain in thoracic spine: Secondary | ICD-10-CM

## 2022-03-01 DIAGNOSIS — M25512 Pain in left shoulder: Secondary | ICD-10-CM

## 2022-03-01 DIAGNOSIS — I10 Essential (primary) hypertension: Secondary | ICD-10-CM

## 2022-03-01 DIAGNOSIS — M533 Sacrococcygeal disorders, not elsewhere classified: Secondary | ICD-10-CM

## 2022-03-01 DIAGNOSIS — Z79899 Other long term (current) drug therapy: Secondary | ICD-10-CM | POA: Diagnosis not present

## 2022-03-01 DIAGNOSIS — M064 Inflammatory polyarthropathy: Secondary | ICD-10-CM | POA: Diagnosis not present

## 2022-03-01 DIAGNOSIS — M7061 Trochanteric bursitis, right hip: Secondary | ICD-10-CM

## 2022-03-01 DIAGNOSIS — F5101 Primary insomnia: Secondary | ICD-10-CM

## 2022-03-01 DIAGNOSIS — S62616S Displaced fracture of proximal phalanx of right little finger, sequela: Secondary | ICD-10-CM

## 2022-03-01 DIAGNOSIS — K219 Gastro-esophageal reflux disease without esophagitis: Secondary | ICD-10-CM

## 2022-03-01 DIAGNOSIS — R5383 Other fatigue: Secondary | ICD-10-CM

## 2022-03-01 DIAGNOSIS — M797 Fibromyalgia: Secondary | ICD-10-CM

## 2022-03-01 DIAGNOSIS — M7711 Lateral epicondylitis, right elbow: Secondary | ICD-10-CM

## 2022-03-01 DIAGNOSIS — M7062 Trochanteric bursitis, left hip: Secondary | ICD-10-CM

## 2022-03-01 DIAGNOSIS — M7712 Lateral epicondylitis, left elbow: Secondary | ICD-10-CM

## 2022-03-01 DIAGNOSIS — M1711 Unilateral primary osteoarthritis, right knee: Secondary | ICD-10-CM

## 2022-03-01 DIAGNOSIS — G8929 Other chronic pain: Secondary | ICD-10-CM

## 2022-03-01 DIAGNOSIS — M722 Plantar fascial fibromatosis: Secondary | ICD-10-CM

## 2022-03-02 LAB — CBC WITH DIFFERENTIAL/PLATELET
Absolute Monocytes: 512 cells/uL (ref 200–950)
Basophils Absolute: 61 cells/uL (ref 0–200)
Basophils Relative: 1.3 %
Eosinophils Absolute: 230 cells/uL (ref 15–500)
Eosinophils Relative: 4.9 %
HCT: 42.6 % (ref 35.0–45.0)
Hemoglobin: 13.8 g/dL (ref 11.7–15.5)
Lymphs Abs: 1264 cells/uL (ref 850–3900)
MCH: 29.8 pg (ref 27.0–33.0)
MCHC: 32.4 g/dL (ref 32.0–36.0)
MCV: 92 fL (ref 80.0–100.0)
MPV: 9.7 fL (ref 7.5–12.5)
Monocytes Relative: 10.9 %
Neutro Abs: 2632 cells/uL (ref 1500–7800)
Neutrophils Relative %: 56 %
Platelets: 266 10*3/uL (ref 140–400)
RBC: 4.63 10*6/uL (ref 3.80–5.10)
RDW: 13.2 % (ref 11.0–15.0)
Total Lymphocyte: 26.9 %
WBC: 4.7 10*3/uL (ref 3.8–10.8)

## 2022-03-02 LAB — COMPLETE METABOLIC PANEL WITH GFR
AG Ratio: 1.8 (calc) (ref 1.0–2.5)
ALT: 15 U/L (ref 6–29)
AST: 15 U/L (ref 10–35)
Albumin: 4.4 g/dL (ref 3.6–5.1)
Alkaline phosphatase (APISO): 79 U/L (ref 37–153)
BUN: 11 mg/dL (ref 7–25)
CO2: 27 mmol/L (ref 20–32)
Calcium: 9.4 mg/dL (ref 8.6–10.4)
Chloride: 104 mmol/L (ref 98–110)
Creat: 1 mg/dL (ref 0.50–1.03)
Globulin: 2.4 g/dL (calc) (ref 1.9–3.7)
Glucose, Bld: 97 mg/dL (ref 65–99)
Potassium: 4.5 mmol/L (ref 3.5–5.3)
Sodium: 137 mmol/L (ref 135–146)
Total Bilirubin: 0.3 mg/dL (ref 0.2–1.2)
Total Protein: 6.8 g/dL (ref 6.1–8.1)
eGFR: 68 mL/min/{1.73_m2} (ref >=60)

## 2022-03-02 NOTE — Progress Notes (Signed)
CBC and CMP WNL

## 2022-04-10 ENCOUNTER — Other Ambulatory Visit: Payer: Self-pay | Admitting: Physician Assistant

## 2022-04-11 NOTE — Telephone Encounter (Signed)
Next Visit: 08/17/2022  Last Visit: 03/01/2022  Last Fill: 02/07/2022  Dx: Fibromyalgia  Current Dose per office note on 03/01/2022: Cymbalta 60 mg 1 capsule daily  Okay to refill Cymbalta?

## 2022-05-18 ENCOUNTER — Other Ambulatory Visit: Payer: Self-pay | Admitting: Physician Assistant

## 2022-05-18 NOTE — Telephone Encounter (Signed)
Next Visit: 08/17/2022  Last Visit: 03/01/2022  Labs: 03/01/2022, CBC and CMP WNL  Eye exam: 07/31/2021 normal   Current Dose per office note 03/01/2022: Plaquenil '200mg'$  by mouth twice daily Monday through Friday only  [id number] polyarthritis   Last Fill: 02/28/2022  Okay to refill Plaquenil?

## 2022-08-03 NOTE — Progress Notes (Signed)
Office Visit Note  Patient: Katie Branch             Date of Birth: 1971-02-10           MRN: [id number]             PCP: Monico Blitz, MD Referring: Monico Blitz, MD Visit Date: 08/17/2022 Occupation: '@GUAROCC'$ @  Subjective:  Pain in joints  History of Present Illness: Katie Branch is a 51 y.o. female history of chronic inflammatory arthritis.  She states she fell at work on Mar 02, 2022.  She had left knee injury resulting in  ACL tear.  She is having instability in her left knee joint.  She is scheduled to have left knee surgery August 22, 2022 by Dr. Stann Mainland.  Patient states that she discontinued hydroxychloroquine in September as she ran out of the prescription.  She realized that when she came off the Plaquenil her vision improved.  She is not having any increased joint pain or joint swelling.  At this point she does not want to restart hydroxychloroquine.  She has an eye examination coming up with the ophthalmologist.  Left shoulder joint pain is better.  She has been having off-and-on discomfort in the trochanteric bursa.  She has chronic discomfort in her right knee joint.  She had no recent episodes of Planter fasciitis.  She has intermittent discomfort in the SI joint.She continues to have generalized pain and discomfort from fibromyalgia syndrome.  She is taking Cymbalta 60 mg p.o. daily Topamax 25 mg at bedtime and also muscle relaxers.  Activities of Daily Living:  Patient reports morning stiffness for 30-60 minutes.   Patient Denies nocturnal pain.  Difficulty dressing/grooming: Denies Difficulty climbing stairs: Denies Difficulty getting out of chair: Denies Difficulty using hands for taps, buttons, cutlery, and/or writing: Denies  Review of Systems  Constitutional:  Positive for fatigue.  HENT:  Negative for mouth sores and mouth dryness.   Eyes:  Negative for dryness.  Respiratory:  Negative for difficulty breathing.   Cardiovascular:  Negative for chest pain and  palpitations.  Gastrointestinal:  Negative for blood in stool, constipation and diarrhea.  Endocrine: Negative for increased urination.  Genitourinary:  Negative for involuntary urination.  Musculoskeletal:  Positive for morning stiffness. Negative for joint pain, gait problem, joint pain, joint swelling, myalgias, muscle weakness, muscle tenderness and myalgias.  Skin:  Negative for color change, rash, hair loss and sensitivity to sunlight.  Allergic/Immunologic: Negative for susceptible to infections.  Neurological:  Negative for dizziness and headaches.  Hematological:  Negative for swollen glands.  Psychiatric/Behavioral:  Positive for sleep disturbance. Negative for depressed mood. The patient is not nervous/anxious.     PMFS History:  Patient Active Problem List   Diagnosis Date Noted   Essential hypertension 01/04/2017   Gastroesophageal reflux disease without esophagitis 01/04/2017   Fibromyalgia 01/03/2017   Other fatigue 01/03/2017   Primary insomnia 01/03/2017   Plantar fasciitis 01/03/2017    Past Medical History:  Diagnosis Date   Arthritis    Fatigue    Fibromyalgia    GERD (gastroesophageal reflux disease)    Hypertension    Plantar fasciitis     Family History  Problem Relation Age of Onset   Cancer Mother        pancreatic    Heart disease Father    COPD Father    Breast cancer Neg Hx    Past Surgical History:  Procedure Laterality Date   FLEXIBLE SIGMOIDOSCOPY  01/06/2021  Procedure: FLEXIBLE SIGMOIDOSCOPY;  Surgeon: Montez Morita, Quillian Quince, MD;  [facility]: [facility];  Service: Gastroenterology;;   FOOT SURGERY Left 11/25/2019   Social History   Social History Narrative   Not on file   Immunization History  Administered Date(s) Administered   Influenza Inj Mdck Quad Pf 08/20/2019   Influenza-Unspecified 08/28/2019   Moderna Sars-Covid-2 Vaccination 01/23/2020, 02/25/2020     Objective: Vital Signs: BP 130/85 (BP Location: Left Arm,  Patient Position: Sitting, Cuff Size: Large)   Pulse 69   Resp 17   Ht '5\' 5"'$  (1.651 m)   Wt 233 lb 6.4 oz (105.9 kg)   BMI 38.84 kg/m    Physical Exam Vitals and nursing note reviewed.  Constitutional:      Appearance: She is well-developed.  HENT:     Head: Normocephalic and atraumatic.  Eyes:     Conjunctiva/sclera: Conjunctivae normal.  Cardiovascular:     Rate and Rhythm: Normal rate and regular rhythm.     Heart sounds: Normal heart sounds.  Pulmonary:     Effort: Pulmonary effort is normal.     Breath sounds: Normal breath sounds.  Abdominal:     General: Bowel sounds are normal.     Palpations: Abdomen is soft.  Musculoskeletal:     Cervical back: Normal range of motion.  Lymphadenopathy:     Cervical: No cervical adenopathy.  Skin:    General: Skin is warm and dry.     Capillary Refill: Capillary refill takes less than 2 seconds.  Neurological:     Mental Status: She is alert and oriented to person, place, and time.  Psychiatric:        Behavior: Behavior normal.      Musculoskeletal Exam: Pelvis and good range of motion.  Shoulder joints, elbow joints, wrist joints, MCPs PIPs and DIPs with good range of motion with no synovitis.  Hip joints and knee joints with good range of motion.  No warmth swelling or effusion was noted.  There was no tenderness over ankles or MTPs.  She had generalized hyperalgesia and positive tender points.  CDAI Exam: CDAI Score: -- Patient Global: --; Provider Global: -- Swollen: --; Tender: -- Joint Exam 08/17/2022   No joint exam has been documented for this visit   There is currently no information documented on the homunculus. Go to the Rheumatology activity and complete the homunculus joint exam.  Investigation: No additional findings.  Imaging: No results found.  Recent Labs: Lab Results  Component Value Date   WBC 4.7 03/01/2022   HGB 13.8 03/01/2022   PLT 266 03/01/2022   NA 137 03/01/2022   K 4.5 03/01/2022    CL 104 03/01/2022   CO2 27 03/01/2022   GLUCOSE 97 03/01/2022   BUN 11 03/01/2022   CREATININE 1.00 03/01/2022   BILITOT 0.3 03/01/2022   AST 15 03/01/2022   ALT 15 03/01/2022   PROT 6.8 03/01/2022   CALCIUM 9.4 03/01/2022    Speciality Comments: PLQ eye exam: 07/31/2021 normal @ [facility]. Dr. Lucious Groves. Follow up in 1 year.  Procedures:  No procedures performed Allergies: Penicillins   Assessment / Plan:     Visit Diagnoses: Inflammatory polyarthritis ([place]) - weak positive anti-CCP, negative RF and -'14 3 3 '$ eta in the past: Patient stopped hydroxychloroquine in September as she ran out of the medication.  She states she does not want to start the medication as she is doing well.  She also thinks that  Plaquenil  may be interfering with her vision.  She has an appointment coming up with the ophthalmologist.  I advised her to contact us if she notices increased pain and swelling off hydroxychloroquine.  High risk medication use -(Plaquenil '200mg'$  by mouth twice daily Monday through Friday only.  Initially started on Plaquenil in September 2022.  She discontinued Plaquenil in September 2023.)  PLQ eye exam: 07/31/2021.  She is scheduled to have next eye examination.  I will check labs today.  Chronic left shoulder pain - Evaluated by Dr. Marlou Sa in April 2022.  She had good range of motion in her left shoulder joint without any discomfort.  She states she has intermittent discomfort.  Closed displaced fracture of proximal phalanx of right little finger, sequela-healed and asymptomatic now.  Trochanteric bursitis of both hips-she has intermittent discomfort in the trochanteric region.  IT band stretches were discussed.  Primary osteoarthritis of right knee-no warmth swelling or effusion was noted.  Chronic pain of left knee-she fell at work in May 2023.  She has been experiencing instability in her left knee joint.  She was diagnosed with ACL tear and is scheduled for surgery on August 22, 2022 by Dr. Stann Mainland.  Chronic SI joint pain-she is a remittent SI joint pain.  Fibromyalgia -she continues to have generalized pain and discomfort from fibromyalgia.  She is on Cymbalta 60 mg 1 capsule daily baclofen 10 mg twice daily as needed for muscle spasms and Topamax 25 mg at bedtime.  Other fatigue-related to fibromyalgia.  Primary insomnia -better on Topamax 25 mg at bedtime  Essential hypertension-blood pressure was normal today.  Gastroesophageal reflux disease without esophagitis  Orders: No orders of the defined types were placed in this encounter.  No orders of the defined types were placed in this encounter.    Follow-Up Instructions: Return in about 5 months (around 01/16/2023) for Inflammatory arthritis.   Bo Merino, MD  Note - This record has been created using Editor, commissioning.  Chart creation errors have been sought, but may not always  have been located. Such creation errors do not reflect on  the standard of medical care.

## 2022-08-17 ENCOUNTER — Encounter: Payer: Self-pay | Admitting: Rheumatology

## 2022-08-17 ENCOUNTER — Ambulatory Visit: Payer: 59 | Attending: Rheumatology | Admitting: Rheumatology

## 2022-08-17 VITALS — BP 130/85 | HR 69 | Resp 17 | Ht 65.0 in | Wt 233.4 lb

## 2022-08-17 DIAGNOSIS — M25562 Pain in left knee: Secondary | ICD-10-CM

## 2022-08-17 DIAGNOSIS — I10 Essential (primary) hypertension: Secondary | ICD-10-CM

## 2022-08-17 DIAGNOSIS — S62616S Displaced fracture of proximal phalanx of right little finger, sequela: Secondary | ICD-10-CM

## 2022-08-17 DIAGNOSIS — M7061 Trochanteric bursitis, right hip: Secondary | ICD-10-CM

## 2022-08-17 DIAGNOSIS — K219 Gastro-esophageal reflux disease without esophagitis: Secondary | ICD-10-CM

## 2022-08-17 DIAGNOSIS — M25512 Pain in left shoulder: Secondary | ICD-10-CM

## 2022-08-17 DIAGNOSIS — R5383 Other fatigue: Secondary | ICD-10-CM

## 2022-08-17 DIAGNOSIS — M722 Plantar fascial fibromatosis: Secondary | ICD-10-CM

## 2022-08-17 DIAGNOSIS — M533 Sacrococcygeal disorders, not elsewhere classified: Secondary | ICD-10-CM

## 2022-08-17 DIAGNOSIS — M546 Pain in thoracic spine: Secondary | ICD-10-CM

## 2022-08-17 DIAGNOSIS — M797 Fibromyalgia: Secondary | ICD-10-CM

## 2022-08-17 DIAGNOSIS — M7711 Lateral epicondylitis, right elbow: Secondary | ICD-10-CM

## 2022-08-17 DIAGNOSIS — M7062 Trochanteric bursitis, left hip: Secondary | ICD-10-CM

## 2022-08-17 DIAGNOSIS — G8929 Other chronic pain: Secondary | ICD-10-CM

## 2022-08-17 DIAGNOSIS — F5101 Primary insomnia: Secondary | ICD-10-CM

## 2022-08-17 DIAGNOSIS — M1711 Unilateral primary osteoarthritis, right knee: Secondary | ICD-10-CM

## 2022-08-17 DIAGNOSIS — Z79899 Other long term (current) drug therapy: Secondary | ICD-10-CM

## 2022-08-17 DIAGNOSIS — M064 Inflammatory polyarthropathy: Secondary | ICD-10-CM

## 2022-08-17 LAB — CBC WITH DIFFERENTIAL/PLATELET
Absolute Monocytes: 576 cells/uL (ref 200–950)
Basophils Absolute: 38 cells/uL (ref 0–200)
Basophils Relative: 0.6 %
Eosinophils Absolute: 192 cells/uL (ref 15–500)
Eosinophils Relative: 3 %
HCT: 42.5 % (ref 35.0–45.0)
Hemoglobin: 14.3 g/dL (ref 11.7–15.5)
Lymphs Abs: 1440 cells/uL (ref 850–3900)
MCH: 30.8 pg (ref 27.0–33.0)
MCHC: 33.6 g/dL (ref 32.0–36.0)
MCV: 91.4 fL (ref 80.0–100.0)
MPV: 9.8 fL (ref 7.5–12.5)
Monocytes Relative: 9 %
Neutro Abs: 4154 cells/uL (ref 1500–7800)
Neutrophils Relative %: 64.9 %
Platelets: 274 10*3/uL (ref 140–400)
RBC: 4.65 10*6/uL (ref 3.80–5.10)
RDW: 13.6 % (ref 11.0–15.0)
Total Lymphocyte: 22.5 %
WBC: 6.4 10*3/uL (ref 3.8–10.8)

## 2022-08-17 LAB — COMPLETE METABOLIC PANEL WITH GFR
AG Ratio: 1.9 (calc) (ref 1.0–2.5)
ALT: 17 U/L (ref 6–29)
AST: 15 U/L (ref 10–35)
Albumin: 4.7 g/dL (ref 3.6–5.1)
Alkaline phosphatase (APISO): 101 U/L (ref 37–153)
BUN: 11 mg/dL (ref 7–25)
CO2: 28 mmol/L (ref 20–32)
Calcium: 9.3 mg/dL (ref 8.6–10.4)
Chloride: 104 mmol/L (ref 98–110)
Creat: 0.92 mg/dL (ref 0.50–1.03)
Globulin: 2.5 g/dL (calc) (ref 1.9–3.7)
Glucose, Bld: 100 mg/dL — ABNORMAL HIGH (ref 65–99)
Potassium: 4.2 mmol/L (ref 3.5–5.3)
Sodium: 139 mmol/L (ref 135–146)
Total Bilirubin: 0.4 mg/dL (ref 0.2–1.2)
Total Protein: 7.2 g/dL (ref 6.1–8.1)
eGFR: 75 mL/min/{1.73_m2} (ref >=60)

## 2022-08-17 NOTE — Progress Notes (Signed)
CBC and CMP are normal.

## 2023-02-02 NOTE — Progress Notes (Deleted)
Office Visit Note  Patient: Katie Branch             Date of Birth: Dec 11, 1970           MRN: [id number]             PCP: Kirstie Peri, MD Referring: Kirstie Peri, MD Visit Date: 02/16/2023 Occupation: @GUAROCC @  Subjective:  No chief complaint on file.   History of Present Illness: Katie Branch is a 52 y.o. female ***     Activities of Daily Living:  Patient reports morning stiffness for *** {minute/hour:19697}.   Patient {ACTIONS;DENIES/REPORTS:21021675::"Denies"} nocturnal pain.  Difficulty dressing/grooming: {ACTIONS;DENIES/REPORTS:21021675::"Denies"} Difficulty climbing stairs: {ACTIONS;DENIES/REPORTS:21021675::"Denies"} Difficulty getting out of chair: {ACTIONS;DENIES/REPORTS:21021675::"Denies"} Difficulty using hands for taps, buttons, cutlery, and/or writing: {ACTIONS;DENIES/REPORTS:21021675::"Denies"}  No Rheumatology ROS completed.   PMFS History:  Patient Active Problem List   Diagnosis Date Noted   Essential hypertension 01/04/2017   Gastroesophageal reflux disease without esophagitis 01/04/2017   Fibromyalgia 01/03/2017   Other fatigue 01/03/2017   Primary insomnia 01/03/2017   Plantar fasciitis 01/03/2017    Past Medical History:  Diagnosis Date   Arthritis    Fatigue    Fibromyalgia    GERD (gastroesophageal reflux disease)    Hypertension    Plantar fasciitis     Family History  Problem Relation Age of Onset   Cancer Mother        pancreatic    Heart disease Father    COPD Father    Breast cancer Neg Hx    Past Surgical History:  Procedure Laterality Date   FLEXIBLE SIGMOIDOSCOPY  01/06/2021   Procedure: FLEXIBLE SIGMOIDOSCOPY;  Surgeon: Dolores Frame, MD;  Location: [facility];  Service: Gastroenterology;;   FOOT SURGERY Left 11/25/2019   Social History   Social History Narrative   Not on file   Immunization History  Administered Date(s) Administered   Influenza Inj Mdck Quad Pf 08/20/2019   Influenza-Unspecified  08/28/2019   Moderna Sars-Covid-2 Vaccination 01/23/2020, 02/25/2020     Objective: Vital Signs: There were no vitals taken for this visit.   Physical Exam   Musculoskeletal Exam: ***  CDAI Exam: CDAI Score: -- Patient Global: --; Provider Global: -- Swollen: --; Tender: -- Joint Exam 02/16/2023   No joint exam has been documented for this visit   There is currently no information documented on the homunculus. Go to the Rheumatology activity and complete the homunculus joint exam.  Investigation: No additional findings.  Imaging: No results found.  Recent Labs: Lab Results  Component Value Date   WBC 6.4 08/17/2022   HGB 14.3 08/17/2022   PLT 274 08/17/2022   NA 139 08/17/2022   K 4.2 08/17/2022   CL 104 08/17/2022   CO2 28 08/17/2022   GLUCOSE 100 (H) 08/17/2022   BUN 11 08/17/2022   CREATININE 0.92 08/17/2022   BILITOT 0.4 08/17/2022   AST 15 08/17/2022   ALT 17 08/17/2022   PROT 7.2 08/17/2022   CALCIUM 9.3 08/17/2022    Speciality Comments: PLQ eye exam: 07/31/2021 normal @ [facility]. Dr. Cephas Darby. Follow up in 1 year.  Procedures:  No procedures performed Allergies: Penicillins   Assessment / Plan:     Visit Diagnoses: Inflammatory polyarthritis  High risk medication use  Chronic left shoulder pain  Trochanteric bursitis of both hips  Primary osteoarthritis of right knee  Chronic pain of left knee  Chronic SI joint pain  Fibromyalgia  Other fatigue  Primary insomnia  Essential  hypertension  Gastroesophageal reflux disease without esophagitis  Lateral epicondylitis of both elbows  Closed displaced fracture of proximal phalanx of right little finger, sequela  Plantar fasciitis  Orders: No orders of the defined types were placed in this encounter.  No orders of the defined types were placed in this encounter.   Face-to-face time spent with patient was *** minutes. Greater than 50% of time was spent in counseling and  coordination of care.  Follow-Up Instructions: No follow-ups on file.   Gearldine Bienenstock, PA-C  Note - This record has been created using Dragon software.  Chart creation errors have been sought, but may not always  have been located. Such creation errors do not reflect on  the standard of medical care.

## 2023-02-16 ENCOUNTER — Ambulatory Visit: Payer: 59 | Admitting: Physician Assistant

## 2023-02-16 DIAGNOSIS — R5383 Other fatigue: Secondary | ICD-10-CM

## 2023-02-16 DIAGNOSIS — K219 Gastro-esophageal reflux disease without esophagitis: Secondary | ICD-10-CM

## 2023-02-16 DIAGNOSIS — I10 Essential (primary) hypertension: Secondary | ICD-10-CM

## 2023-02-16 DIAGNOSIS — Z79899 Other long term (current) drug therapy: Secondary | ICD-10-CM

## 2023-02-16 DIAGNOSIS — M797 Fibromyalgia: Secondary | ICD-10-CM

## 2023-02-16 DIAGNOSIS — M1711 Unilateral primary osteoarthritis, right knee: Secondary | ICD-10-CM

## 2023-02-16 DIAGNOSIS — M7061 Trochanteric bursitis, right hip: Secondary | ICD-10-CM

## 2023-02-16 DIAGNOSIS — M7711 Lateral epicondylitis, right elbow: Secondary | ICD-10-CM

## 2023-02-16 DIAGNOSIS — M722 Plantar fascial fibromatosis: Secondary | ICD-10-CM

## 2023-02-16 DIAGNOSIS — M064 Inflammatory polyarthropathy: Secondary | ICD-10-CM

## 2023-02-16 DIAGNOSIS — G8929 Other chronic pain: Secondary | ICD-10-CM

## 2023-02-16 DIAGNOSIS — S62616S Displaced fracture of proximal phalanx of right little finger, sequela: Secondary | ICD-10-CM

## 2023-02-16 DIAGNOSIS — F5101 Primary insomnia: Secondary | ICD-10-CM

## 2023-04-19 DIAGNOSIS — E7800 Pure hypercholesterolemia, unspecified: Secondary | ICD-10-CM | POA: Diagnosis not present

## 2023-04-19 DIAGNOSIS — F419 Anxiety disorder, unspecified: Secondary | ICD-10-CM | POA: Diagnosis not present

## 2023-04-19 DIAGNOSIS — Z79899 Other long term (current) drug therapy: Secondary | ICD-10-CM | POA: Diagnosis not present

## 2023-04-19 DIAGNOSIS — Z0000 Encounter for general adult medical examination without abnormal findings: Secondary | ICD-10-CM | POA: Diagnosis not present

## 2023-04-19 DIAGNOSIS — Z1331 Encounter for screening for depression: Secondary | ICD-10-CM | POA: Diagnosis not present

## 2023-04-19 DIAGNOSIS — Z299 Encounter for prophylactic measures, unspecified: Secondary | ICD-10-CM | POA: Diagnosis not present

## 2023-04-19 DIAGNOSIS — I10 Essential (primary) hypertension: Secondary | ICD-10-CM | POA: Diagnosis not present

## 2023-04-27 DIAGNOSIS — Z1212 Encounter for screening for malignant neoplasm of rectum: Secondary | ICD-10-CM | POA: Diagnosis not present

## 2023-04-27 DIAGNOSIS — Z1211 Encounter for screening for malignant neoplasm of colon: Secondary | ICD-10-CM | POA: Diagnosis not present

## 2023-07-21 ENCOUNTER — Other Ambulatory Visit: Payer: Self-pay | Admitting: Internal Medicine

## 2023-07-21 DIAGNOSIS — Z1231 Encounter for screening mammogram for malignant neoplasm of breast: Secondary | ICD-10-CM

## 2023-08-27 ENCOUNTER — Encounter (HOSPITAL_COMMUNITY): Payer: Self-pay

## 2023-08-27 ENCOUNTER — Emergency Department (HOSPITAL_COMMUNITY)
Admission: EM | Admit: 2023-08-27 | Discharge: 2023-08-27 | Discharge status: Against Medical Advice | Payer: Managed Care, Other (non HMO) | Attending: Emergency Medicine | Admitting: Emergency Medicine

## 2023-08-27 ENCOUNTER — Other Ambulatory Visit: Payer: Self-pay

## 2023-08-27 DIAGNOSIS — K625 Hemorrhage of anus and rectum: Secondary | ICD-10-CM | POA: Diagnosis present

## 2023-08-27 DIAGNOSIS — Z79899 Other long term (current) drug therapy: Secondary | ICD-10-CM | POA: Diagnosis not present

## 2023-08-27 DIAGNOSIS — D72829 Elevated white blood cell count, unspecified: Secondary | ICD-10-CM | POA: Insufficient documentation

## 2023-08-27 DIAGNOSIS — I10 Essential (primary) hypertension: Secondary | ICD-10-CM | POA: Diagnosis not present

## 2023-08-27 LAB — TYPE AND SCREEN
ABO/RH(D): A POS
Antibody Screen: NEGATIVE

## 2023-08-27 LAB — COMPREHENSIVE METABOLIC PANEL
ALT: 20 U/L (ref 0–44)
AST: 17 U/L (ref 15–41)
Albumin: 4.4 g/dL (ref 3.5–5.0)
Alkaline Phosphatase: 96 U/L (ref 38–126)
Anion gap: 11 (ref 5–15)
BUN: 13 mg/dL (ref 6–20)
CO2: 23 mmol/L (ref 22–32)
Calcium: 9.3 mg/dL (ref 8.9–10.3)
Chloride: 103 mmol/L (ref 98–111)
Creatinine, Ser: 0.97 mg/dL (ref 0.44–1.00)
GFR, Estimated: >60 mL/min (ref >60)
Glucose, Bld: 148 mg/dL — ABNORMAL HIGH (ref 70–99)
Potassium: 3.4 mmol/L — ABNORMAL LOW (ref 3.5–5.1)
Sodium: 137 mmol/L (ref 135–145)
Total Bilirubin: 0.6 mg/dL (ref 0.3–1.2)
Total Protein: 7.6 g/dL (ref 6.5–8.1)

## 2023-08-27 LAB — CBC
HCT: 45.7 % (ref 36.0–46.0)
Hemoglobin: 14.2 g/dL (ref 12.0–15.0)
MCH: 28.6 pg (ref 26.0–34.0)
MCHC: 31.1 g/dL (ref 30.0–36.0)
MCV: 92 fL (ref 80.0–100.0)
Platelets: 359 10*3/uL (ref 150–400)
RBC: 4.97 MIL/uL (ref 3.87–5.11)
RDW: 15.4 % (ref 11.5–15.5)
WBC: 11.5 10*3/uL — ABNORMAL HIGH (ref 4.0–10.5)
nRBC: 0 % (ref 0.0–0.2)

## 2023-08-27 NOTE — ED Provider Notes (Signed)
[place] EMERGENCY DEPARTMENT AT [facility] Provider Note   CSN: [id number] Arrival date & time: 08/27/23  1312     History  Chief Complaint  Patient presents with   Rectal Bleeding    Katie Branch is a 52 y.o. female.  Patient to ED for evaluation of rectal bleeding. She reports having a long history of constipation and took a laxative last night after having no BM for 5-6 days. She had a bowel movement and some bleeding and abdominal cramping with it late evening. Through the night she had multiple episodes of passing only blood, including 2 after arrival to the ED. No syncope or near syncope. She reports persistent mild lower abdominal cramping. No previous abdominal surgeries. She states this occurred once in the past diagnosed as Colitis.  No fever or vomiting, though she reports nausea.   The history is provided by the patient. No language interpreter was used.  Rectal Bleeding      Home Medications Prior to Admission medications   Medication Sig Start Date End Date Taking? Authorizing Provider  baclofen (LIORESAL) 10 MG tablet Take 1 tablet (10 mg total) by mouth 2 (two) times daily as needed for muscle spasms. 12/02/20   Pollyann Savoy, MD  cyclobenzaprine (FLEXERIL) 5 MG tablet Take 5 mg by mouth as needed for muscle spasms.    [provider]  DULoxetine (CYMBALTA) 60 MG capsule TAKE 1 CAPSULE BY MOUTH DAILY 04/11/22   Gearldine Bienenstock, PA-C  hydroxychloroquine (PLAQUENIL) 200 MG tablet TAKE 1 TABLET BY MOUTH TWICE  DAILY MONDAY THROUGH FRIDAY  ONLY. NONE ON SATURDAY OR SUNDAY Patient not taking: Reported on 08/17/2022 05/18/22   Gearldine Bienenstock, PA-C  lisinopril (PRINIVIL,ZESTRIL) 20 MG tablet Take 20 mg by mouth at bedtime. 05/17/17   [provider]  rosuvastatin (CRESTOR) 20 MG tablet Take 20 mg by mouth at bedtime.    [provider]  topiramate (TOPAMAX) 25 MG tablet Take 1 tablet by mouth at bedtime. 04/19/21   Pollyann Savoy,  MD      Allergies    Penicillins    Review of Systems   Review of Systems  Gastrointestinal:  Positive for hematochezia.    Physical Exam Updated Vital Signs BP 129/69 (BP Location: Right Arm)   Pulse (!) 103   Temp 98.9 F (37.2 C) (Oral)   Resp 20   Ht 5\' 5"  (1.651 m)   Wt 104.3 kg   LMP 06/20/2017   SpO2 100%   BMI 38.27 kg/m  Physical Exam Vitals and nursing note reviewed.  Constitutional:      Appearance: Normal appearance.  Eyes:     Comments: No conjunctival pallor.   Cardiovascular:     Rate and Rhythm: Normal rate and regular rhythm.  Pulmonary:     Effort: Pulmonary effort is normal.  Abdominal:     General: There is no distension.     Palpations: Abdomen is soft.     Tenderness: There is no abdominal tenderness.  Genitourinary:    Comments: No active bleeding or gross blood at rectum. No external hemorrhoids visualized. Rectum nontender. No stool in the rectal vault but stool card positive for blood.  Skin:    General: Skin is warm and dry.     Coloration: Skin is not pale.  Neurological:     Mental Status: She is alert and oriented to person, place, and time.     ED Results / Procedures / Treatments   Labs (  all labs ordered are listed, but only abnormal results are displayed) Labs Reviewed  COMPREHENSIVE METABOLIC PANEL - Abnormal; Notable for the following components:      Result Value   Potassium 3.4 (*)    Glucose, Bld 148 (*)    All other components within normal limits  CBC - Abnormal; Notable for the following components:   WBC 11.5 (*)    All other components within normal limits  CBC WITH DIFFERENTIAL/PLATELET  POC OCCULT BLOOD, ED  TYPE AND SCREEN   Results for orders placed or performed during the hospital encounter of 08/27/23  Comprehensive metabolic panel  Result Value Ref Range   Sodium 137 135 - 145 mmol/L   Potassium 3.4 (L) 3.5 - 5.1 mmol/L   Chloride 103 98 - 111 mmol/L   CO2 23 22 - 32 mmol/L   Glucose, Bld 148 (H)  70 - 99 mg/dL   BUN 13 6 - 20 mg/dL   Creatinine, Ser 3.32 0.44 - 1.00 mg/dL   Calcium 9.3 8.9 - 95.1 mg/dL   Total Protein 7.6 6.5 - 8.1 g/dL   Albumin 4.4 3.5 - 5.0 g/dL   AST 17 15 - 41 U/L   ALT 20 0 - 44 U/L   Alkaline Phosphatase 96 38 - 126 U/L   Total Bilirubin 0.6 0.3 - 1.2 mg/dL   GFR, Estimated >88 >41 mL/min   Anion gap 11 5 - 15  CBC  Result Value Ref Range   WBC 11.5 (H) 4.0 - 10.5 K/uL   RBC 4.97 3.87 - 5.11 MIL/uL   Hemoglobin 14.2 12.0 - 15.0 g/dL   HCT 66.0 63.0 - 16.0 %   MCV 92.0 80.0 - 100.0 fL   MCH 28.6 26.0 - 34.0 pg   MCHC 31.1 30.0 - 36.0 g/dL   RDW 10.9 32.3 - 55.7 %   Platelets 359 150 - 400 K/uL   nRBC 0.0 0.0 - 0.2 %  Type and screen [facility]  Result Value Ref Range   ABO/RH(D) A POS    Antibody Screen NEG    Sample Expiration      08/30/2023,2359 Performed at [facility], [address], [place], [place] [address]     EKG None  Radiology No results found.  Procedures Procedures    Medications Ordered in ED Medications - No data to display  ED Course/ Medical Decision Making/ A&P                                 Medical Decision Making This patient presents to the ED for concern of blood per rectum this involves an extensive number of treatment options, and is a complaint that carries with it a high risk of complications and morbidity.  The differential diagnosis includes severe anemia, perforated bowel, dehydration, hemorrhoids, colitis, GI bleeding   Co morbidities that complicate the patient evaluation  History of constipation, HTN, GERD, fibromyalgia, arthritis   Additional history obtained:  External records from outside source obtained and reviewed including EMR reviewed. GI consultation with flex sigmoidoscopy in 2022 for screening   Lab Tests:  I Ordered, and personally interpreted labs.  The pertinent results include:  hemoglobin 14.2. no leukocytosis     Problem List / ED Course / Critical  interventions / Medication management  GI bleeding - BRB on multiple occasions since last night.  No drop in hgb. VSS Benign abdominal exam  Test /  Admission - Considered:  Patient to be observed Repeat CBC in 4 hours to recheck hemoglobin If no drop, VS remain stable, anticipate discharge home.   Patient left without informing staff prior to repeat CBC.    Amount and/or Complexity of Data Reviewed Labs: ordered.           Final Clinical Impression(s) / ED Diagnoses Final diagnoses:  Rectal bleeding    Rx / DC Orders ED Discharge Orders     None         Danne Harbor 08/27/23 Guinevere Ferrari, MD 08/31/23 1240

## 2023-08-27 NOTE — ED Triage Notes (Signed)
Pt reports she was constipated and took a laxative yesterday and after she had a BM she has had bright red bleeding when she wipes.

## 2023-11-28 IMAGING — MG MM DIGITAL SCREENING BILAT W/ TOMO AND CAD
8 series · 8 of 24 positions shown · non-contrast
Comparison: Previous exam(s).

CLINICAL DATA: Screening.

EXAM:
DIGITAL SCREENING BILATERAL MAMMOGRAM WITH TOMOSYNTHESIS AND CAD
TECHNIQUE: Bilateral screening digital craniocaudal and mediolateral oblique
mammograms were obtained. Bilateral screening digital breast
tomosynthesis was performed. The images were evaluated with
computer-aided detection.

[R MLO synth-2D]
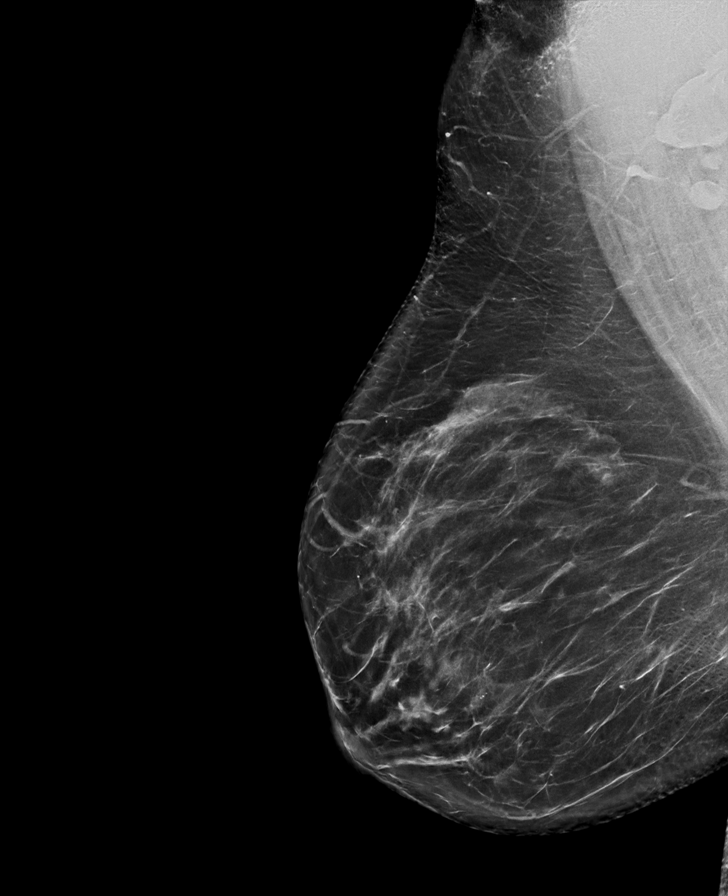

[L CC synth-2D]
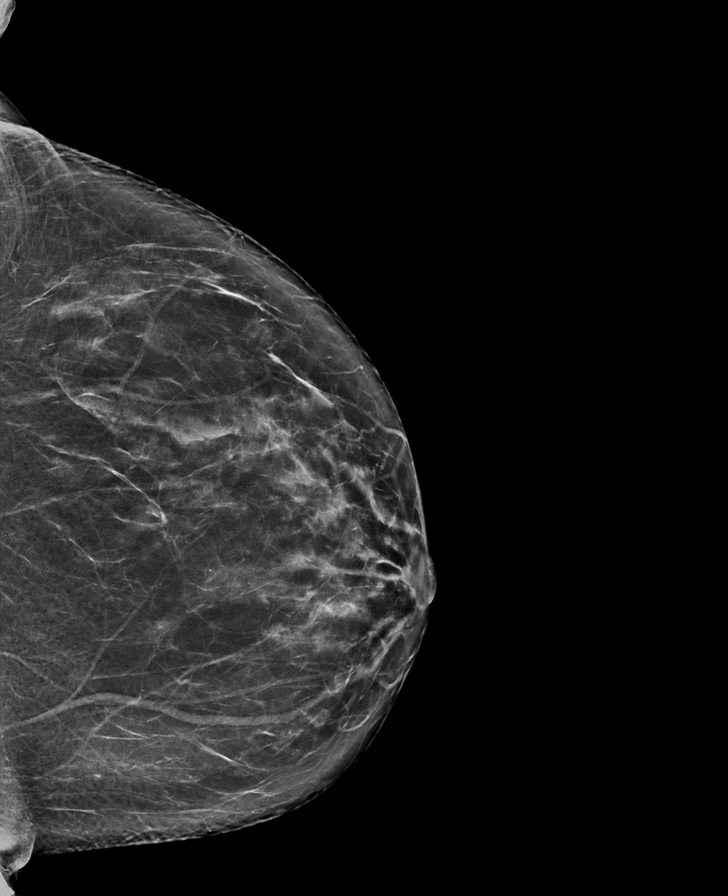

[R CC synth-2D]
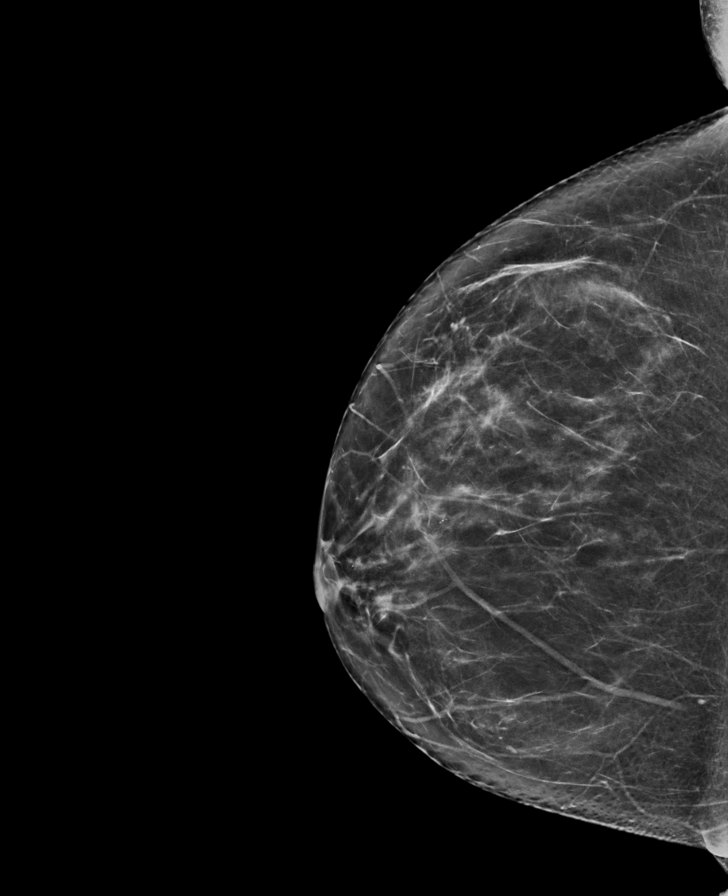

[L MLO synth-2D]
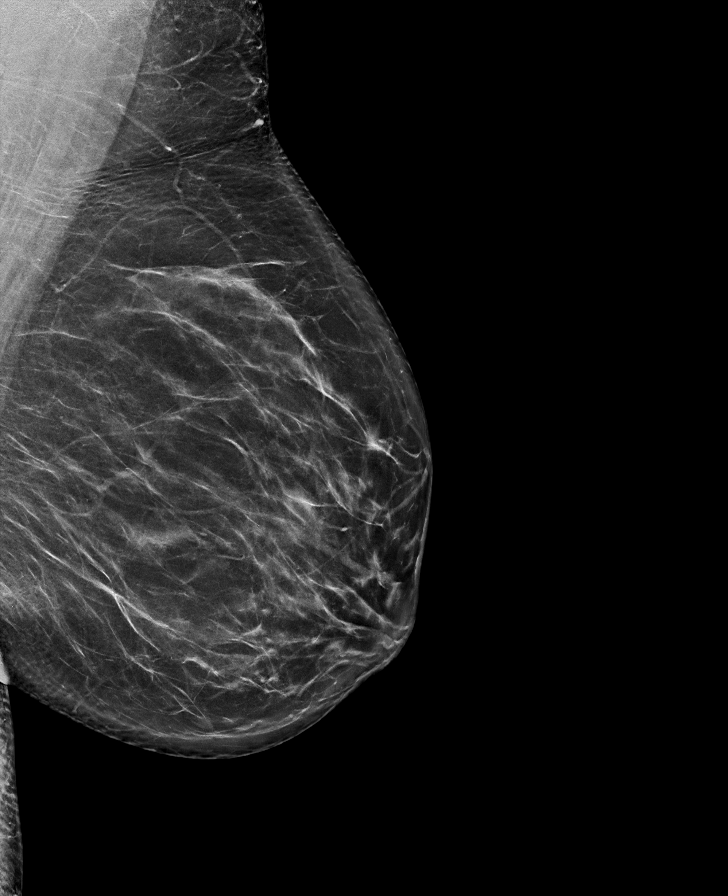

[L MLO tomo · tomo slice 43/86.0]
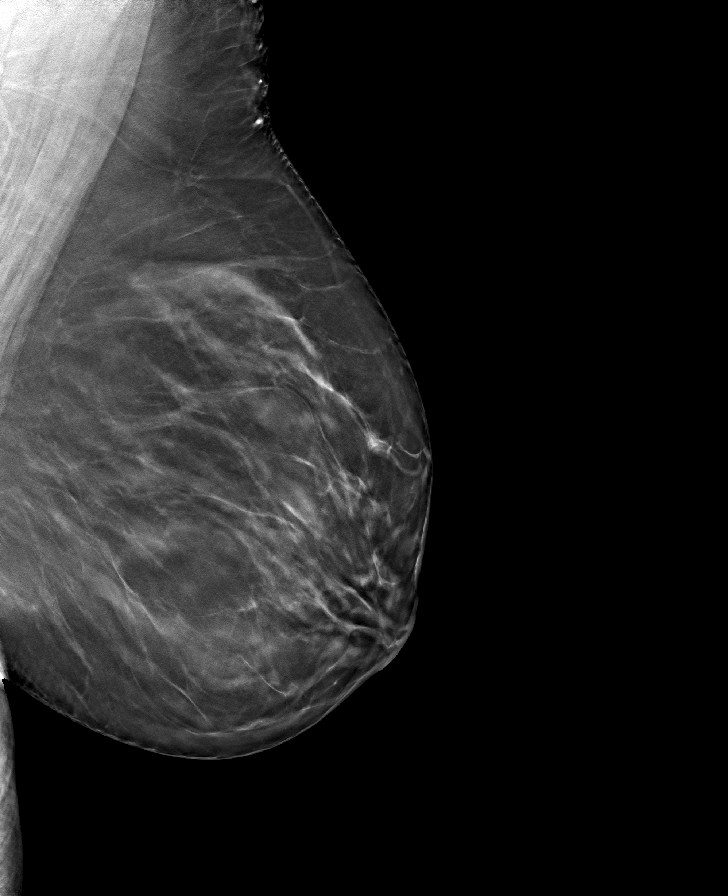

[L CC tomo · tomo slice 40/79.0]
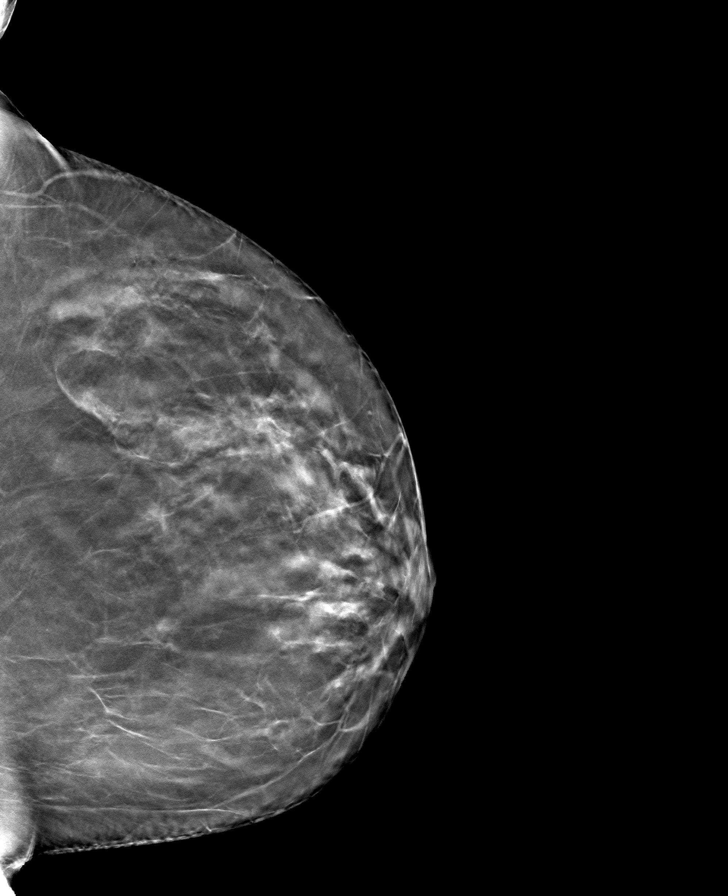

[R CC tomo · tomo slice 37/72.0]
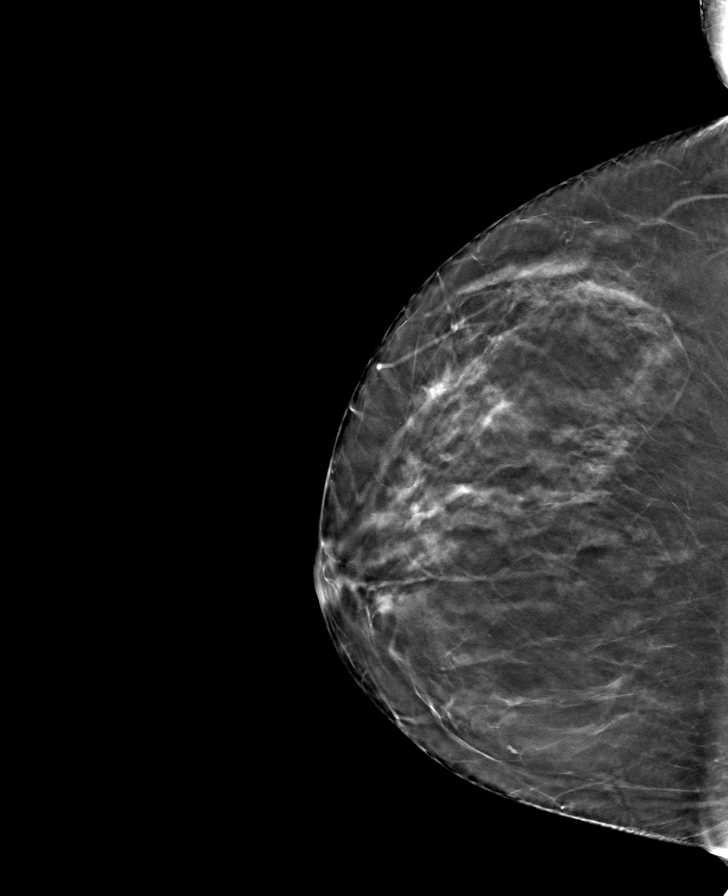

[R MLO tomo · tomo slice 45/90.0]
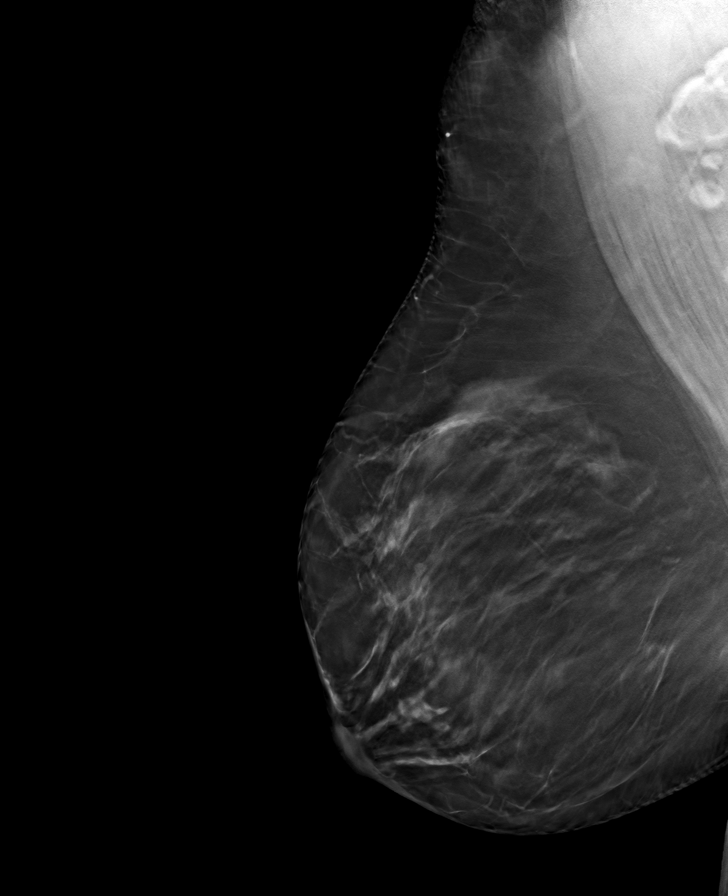

[8 of 24 positions shown; findings below may reference images not displayed]

ACR Breast Density Category b: There are scattered areas of
fibroglandular density.
FINDINGS: There are no findings suspicious for malignancy.
IMPRESSION: No mammographic evidence of malignancy. A result letter of this
screening mammogram will be mailed directly to the patient.

RECOMMENDATION:
Screening mammogram in one year. (Code:51-O-LD2)

BI-RADS CATEGORY  1: Negative.

## 2024-05-14 NOTE — Progress Notes (Unsigned)
 Office Visit Note  Patient: Katie Branch             Date of Birth: 08/24/1971           MRN: [id number]             PCP: Maree Isles, MD Referring: Maree Isles, MD Visit Date: 05/16/2024 Occupation: @GUAROCC @  Subjective:  Discuss restarting cymbalta    History of Present Illness: Katie Branch is a 53 y.o. female with history of inflammatory arthritis.  She is not currently taking any immunomodulatory medications.  Patient was last seen in the office on 08/17/22.  Patient states that she had been taking Cymbalta  60 mg daily.  She has found Cymbalta  to be effective in managing her pain levels but had noticed some increased weight gain.  Patient states that she recently ran out of her prescription for Cymbalta  and has been out of medication for the past 2 weeks.  Patient states that she has been experiencing withdrawal symptoms including hot flashes, increased anxiety, increased pain, irritability, nightmares, and diarrhea.  Patient states that her PCP would not refill the medication until she was seen.  Patient presented today to discuss resuming Cymbalta  60 mg daily.  Patient states in the future she may want to take a reduced dose due to weight gain. Patient states that prior to discontinuing Cymbalta  her pain levels have been adequately controlled.  She has not noticed any joint swelling.  Activities of Daily Living:  Patient reports morning stiffness for 5-10 minutes.   Patient Reports nocturnal pain.  Difficulty dressing/grooming: Denies Difficulty climbing stairs: Reports Difficulty getting out of chair: Reports Difficulty using hands for taps, buttons, cutlery, and/or writing: Denies  Review of Systems  Constitutional:  Positive for fatigue.  HENT:  Positive for mouth dryness. Negative for mouth sores.   Eyes:  Positive for dryness.  Respiratory:  Positive for shortness of breath.   Cardiovascular:  Positive for chest pain. Negative for palpitations.  Gastrointestinal:   Positive for diarrhea. Negative for blood in stool and constipation.  Endocrine: Negative for increased urination.  Genitourinary:  Negative for involuntary urination.  Musculoskeletal:  Positive for joint pain, gait problem, joint pain, myalgias, morning stiffness and myalgias. Negative for joint swelling, muscle weakness and muscle tenderness.  Skin:  Positive for sensitivity to sunlight. Negative for color change, rash and hair loss.  Allergic/Immunologic: Negative for susceptible to infections.  Neurological:  Positive for dizziness. Negative for headaches.  Hematological:  Positive for swollen glands.  Psychiatric/Behavioral:  Positive for depressed mood and sleep disturbance. The patient is nervous/anxious.     PMFS History:  Patient Active Problem List   Diagnosis Date Noted   Essential hypertension 01/04/2017   Gastroesophageal reflux disease without esophagitis 01/04/2017   Fibromyalgia 01/03/2017   Other fatigue 01/03/2017   Primary insomnia 01/03/2017   Plantar fasciitis 01/03/2017    Past Medical History:  Diagnosis Date   Arthritis    Fatigue    Fibromyalgia    GERD (gastroesophageal reflux disease)    Hypertension    Plantar fasciitis     Family History  Problem Relation Age of Onset   Cancer Mother        pancreatic    Heart disease Father    COPD Father    Breast cancer Neg Hx    Past Surgical History:  Procedure Laterality Date   FLEXIBLE SIGMOIDOSCOPY  01/06/2021   Procedure: FLEXIBLE SIGMOIDOSCOPY;  Surgeon: Eartha Angelia Sieving, MD;  Location: [facility];  Service: Gastroenterology;;   FOOT SURGERY Left 11/25/2019   Social History   Social History Narrative   Not on file   Immunization History  Administered Date(s) Administered   Influenza Inj Mdck Quad Pf 08/20/2019   Influenza-Unspecified 08/28/2019   Moderna Sars-Covid-2 Vaccination 01/23/2020, 02/25/2020     Objective: Vital Signs: BP 137/82 (BP Location: Left Arm, Patient  Position: Sitting, Cuff Size: Normal)   Pulse 89   Resp 16   Ht 5' 5 (1.651 m)   Wt 236 lb 6.4 oz (107.2 kg)   LMP 06/20/2017   BMI 39.34 kg/m    Physical Exam Vitals and nursing note reviewed.  Constitutional:      Appearance: She is well-developed.  HENT:     Head: Normocephalic and atraumatic.  Eyes:     Conjunctiva/sclera: Conjunctivae normal.  Cardiovascular:     Rate and Rhythm: Normal rate and regular rhythm.     Heart sounds: Normal heart sounds.  Pulmonary:     Effort: Pulmonary effort is normal.     Breath sounds: Normal breath sounds.  Abdominal:     General: Bowel sounds are normal.     Palpations: Abdomen is soft.  Musculoskeletal:     Cervical back: Normal range of motion.  Lymphadenopathy:     Cervical: No cervical adenopathy.  Skin:    General: Skin is warm and dry.     Capillary Refill: Capillary refill takes less than 2 seconds.  Neurological:     Mental Status: She is alert and oriented to person, place, and time.  Psychiatric:        Behavior: Behavior normal.      Musculoskeletal Exam: C-spine, thoracic spine, lumbar spine good range of motion.  Shoulder joints, elbow joints, wrist joints, MCPs, PIPs, DIPs have good range of motion with no synovitis.  Complete fist formation bilaterally.  Hip joints have good range of motion with no groin pain.  Knee joints have good range of motion no warmth or effusion.  Ankle joints have good range of motion with no tenderness or joint swelling.  CDAI Exam: CDAI Score: -- Patient Global: --; Provider Global: -- Swollen: --; Tender: -- Joint Exam 05/16/2024   No joint exam has been documented for this visit   There is currently no information documented on the homunculus. Go to the Rheumatology activity and complete the homunculus joint exam.  Investigation: No additional findings.  Imaging: No results found.  Recent Labs: Lab Results  Component Value Date   WBC 11.5 (H) 08/27/2023   HGB 14.2  08/27/2023   PLT 359 08/27/2023   NA 137 08/27/2023   K 3.4 (L) 08/27/2023   CL 103 08/27/2023   CO2 23 08/27/2023   GLUCOSE 148 (H) 08/27/2023   BUN 13 08/27/2023   CREATININE 0.97 08/27/2023   BILITOT 0.6 08/27/2023   ALKPHOS 96 08/27/2023   AST 17 08/27/2023   ALT 20 08/27/2023   PROT 7.6 08/27/2023   ALBUMIN 4.4 08/27/2023   CALCIUM 9.3 08/27/2023    Speciality Comments: PLQ eye exam: 07/31/2021 normal @ [facility]. Dr. George Glosson. Follow up in 1 year.  Procedures:  No procedures performed Allergies: Penicillins   Assessment / Plan:     Visit Diagnoses: Inflammatory polyarthritis (HCC) - weak positive anti-CCP, negative RF and -14 3 3  eta in the past: She has no joint tenderness or synovitis on examination today.  She has not had any signs or symptoms of an inflammatory  arthritis flare.  She is not currently taking immunosuppressive agents and does not require immunosuppression at this time.  She was advised to notify us  if she develops any new or worsening symptoms.  She will follow-up in the office in 6 months or sooner if needed.  High risk medication use -Not currently taking any immunosuppressive agents -(Plaquenil  200mg  BID Monday-Friday only.Initially started on Plaquenil  in September 2022.  She d/c Plaquenil  in September 2023.)  PLQ eye exam: 07/31/2021.  Chronic left shoulder pain - Evaluated by Dr. Addie in April 2022. Good ROM with no discomfort at this time.   Trochanteric bursitis of both hips: Not currently symptomatic.   Primary osteoarthritis of right knee: No warmth or effusion noted.   Chronic pain of left knee - dx with ACL tear--scheduled for surgery 08/22/2022 by Dr. Sharl. Doing well.  Good ROM with no effusion.    Chronic SI joint pain: Intermittent discomfort.   Fibromyalgia -Her fibromyalgia has been well-controlled taking Cymbalta  60 mg daily.  She has been out of the prescription for Cymbalta  for the past 2 weeks and is currently having  withdrawal symptoms.  She has been experiencing diarrhea, nightmares, irritability, anxiety, hot flashes, and increased pain.  Patient states that she has been out of work due to the severity of these withdrawal symptoms.  Patient would like to resume Cymbalta  60 mg daily.  Patient states in the future she is interested in reducing the dose of Cymbalta  due to weight gain while taking it but at this time would like to resume 60 mg daily since this was a therapeutic dose for her.    Other fatigue: Patient presents today experiencing withdrawal symptoms since being off of Cymbalta  for the past 2 weeks.  She has had increased anxiety, irritability, and nightmares. Her energy level has been adequately controlled while taking Cymbalta  60 mg daily.  A refill was sent to the pharmacy today.  Primary insomnia: Patient mains on Topamax  25 mg 1 tablet at bedtime.   Other medical conditions are listed as follows:  Essential hypertension: Blood pressure was 137/82 today in the office.  Gastroesophageal reflux disease without esophagitis  Orders: No orders of the defined types were placed in this encounter.  Meds ordered this encounter  Medications   DULoxetine  (CYMBALTA ) 60 MG capsule    Sig: Take 1 capsule (60 mg total) by mouth daily.    Dispense:  90 capsule    Refill:  1     Follow-Up Instructions: Return in about 6 months (around 11/16/2024).   Waddell CHRISTELLA Craze, PA-C  Note - This record has been created using Dragon software.  Chart creation errors have been sought, but may not always  have been located. Such creation errors do not reflect on  the standard of medical care.

## 2024-05-16 ENCOUNTER — Ambulatory Visit: Payer: Self-pay | Attending: Physician Assistant | Admitting: Physician Assistant

## 2024-05-16 ENCOUNTER — Encounter: Payer: Self-pay | Admitting: Physician Assistant

## 2024-05-16 VITALS — BP 137/82 | HR 89 | Resp 16 | Ht 65.0 in | Wt 236.4 lb

## 2024-05-16 DIAGNOSIS — M25512 Pain in left shoulder: Secondary | ICD-10-CM | POA: Diagnosis not present

## 2024-05-16 DIAGNOSIS — M7061 Trochanteric bursitis, right hip: Secondary | ICD-10-CM | POA: Diagnosis not present

## 2024-05-16 DIAGNOSIS — I10 Essential (primary) hypertension: Secondary | ICD-10-CM

## 2024-05-16 DIAGNOSIS — F5101 Primary insomnia: Secondary | ICD-10-CM

## 2024-05-16 DIAGNOSIS — M533 Sacrococcygeal disorders, not elsewhere classified: Secondary | ICD-10-CM

## 2024-05-16 DIAGNOSIS — Z79899 Other long term (current) drug therapy: Secondary | ICD-10-CM

## 2024-05-16 DIAGNOSIS — M064 Inflammatory polyarthropathy: Secondary | ICD-10-CM

## 2024-05-16 DIAGNOSIS — M1711 Unilateral primary osteoarthritis, right knee: Secondary | ICD-10-CM

## 2024-05-16 DIAGNOSIS — M797 Fibromyalgia: Secondary | ICD-10-CM

## 2024-05-16 DIAGNOSIS — K219 Gastro-esophageal reflux disease without esophagitis: Secondary | ICD-10-CM

## 2024-05-16 DIAGNOSIS — M7062 Trochanteric bursitis, left hip: Secondary | ICD-10-CM

## 2024-05-16 DIAGNOSIS — M25562 Pain in left knee: Secondary | ICD-10-CM

## 2024-05-16 DIAGNOSIS — R5383 Other fatigue: Secondary | ICD-10-CM

## 2024-05-16 DIAGNOSIS — G8929 Other chronic pain: Secondary | ICD-10-CM

## 2024-05-16 MED ORDER — DULOXETINE HCL 60 MG PO CPEP: 60.0000 mg | ORAL_CAPSULE | Freq: Every day | ORAL | 1 refills | Status: DC

## 2024-10-09 ENCOUNTER — Encounter

## 2024-11-03 ENCOUNTER — Other Ambulatory Visit: Payer: Self-pay | Admitting: Physician Assistant

## 2024-11-04 NOTE — Telephone Encounter (Addendum)
 Last Fill: 05/16/2024  Next Visit: 11/18/2024  Last Visit: 05/16/2024  Dx:  Inflammatory polyarthritis   Current Dose per office note on 05/16/2024: Cymbalta  60 mg daily.   Okay to refill Cymbalta ?

## 2024-11-05 NOTE — Progress Notes (Unsigned)
 "  Office Visit Note  Patient: Katie Branch             Date of Birth: 1971/10/06           MRN: [id number]             PCP: Maree Isles, MD Referring: Maree Isles, MD Visit Date: 11/18/2024 Occupation: Data Unavailable  Subjective:  No chief complaint on file.   History of Present Illness: Katie Branch is a 54 y.o. female ***     Activities of Daily Living:  Patient reports morning stiffness for *** {minute/hour:19697}.   Patient {ACTIONS;DENIES/REPORTS:21021675::Denies} nocturnal pain.  Difficulty dressing/grooming: {ACTIONS;DENIES/REPORTS:21021675::Denies} Difficulty climbing stairs: {ACTIONS;DENIES/REPORTS:21021675::Denies} Difficulty getting out of chair: {ACTIONS;DENIES/REPORTS:21021675::Denies} Difficulty using hands for taps, buttons, cutlery, and/or writing: {ACTIONS;DENIES/REPORTS:21021675::Denies}  No Rheumatology ROS completed.   PMFS History:  Patient Active Problem List   Diagnosis Date Noted   Essential hypertension 01/04/2017   Gastroesophageal reflux disease without esophagitis 01/04/2017   Fibromyalgia 01/03/2017   Other fatigue 01/03/2017   Primary insomnia 01/03/2017   Plantar fasciitis 01/03/2017    Past Medical History:  Diagnosis Date   Arthritis    Fatigue    Fibromyalgia    GERD (gastroesophageal reflux disease)    Hypertension    Plantar fasciitis     Family History  Problem Relation Age of Onset   Cancer Mother        pancreatic    Heart disease Father    COPD Father    Breast cancer Neg Hx    Past Surgical History:  Procedure Laterality Date   FLEXIBLE SIGMOIDOSCOPY  01/06/2021   Procedure: FLEXIBLE SIGMOIDOSCOPY;  Surgeon: Eartha Angelia Sieving, MD;  Location: [facility];  Service: Gastroenterology;;   FOOT SURGERY Left 11/25/2019   Social History[1] Social History   Social History Narrative   Not on file     Immunization History  Administered Date(s) Administered   Influenza Inj Mdck Quad Pf  08/20/2019   Influenza-Unspecified 08/28/2019   Moderna Sars-Covid-2 Vaccination 01/23/2020, 02/25/2020     Objective: Vital Signs: LMP 06/20/2017    Physical Exam   Musculoskeletal Exam: ***  CDAI Exam: CDAI Score: -- Patient Global: --; Provider Global: -- Swollen: --; Tender: -- Joint Exam 11/18/2024   No joint exam has been documented for this visit   There is currently no information documented on the homunculus. Go to the Rheumatology activity and complete the homunculus joint exam.  Investigation: No additional findings.  Imaging: No results found.  Recent Labs: Lab Results  Component Value Date   WBC 11.5 (H) 08/27/2023   HGB 14.2 08/27/2023   PLT 359 08/27/2023   NA 137 08/27/2023   K 3.4 (L) 08/27/2023   CL 103 08/27/2023   CO2 23 08/27/2023   GLUCOSE 148 (H) 08/27/2023   BUN 13 08/27/2023   CREATININE 0.97 08/27/2023   BILITOT 0.6 08/27/2023   ALKPHOS 96 08/27/2023   AST 17 08/27/2023   ALT 20 08/27/2023   PROT 7.6 08/27/2023   ALBUMIN 4.4 08/27/2023   CALCIUM 9.3 08/27/2023    Speciality Comments: PLQ eye exam: 07/31/2021 normal @ [facility] Dr. George Glosson. Follow up in 1 year.  Procedures:  No procedures performed Allergies: Penicillins   Assessment / Plan:     Visit Diagnoses: No diagnosis found.  Orders: No orders of the defined types were placed in this encounter.  No orders of the defined types were placed in this encounter.   Face-to-face time  spent with patient was *** minutes. Greater than 50% of time was spent in counseling and coordination of care.  Follow-Up Instructions: No follow-ups on file.   Daved JAYSON Gavel, CMA  Note - This record has been created using [employer].  Chart creation errors have been sought, but may not always  have been located. Such creation errors do not reflect on  the standard of medical care.    [1]  Social History Tobacco Use   Smoking status: Never    Passive exposure:  Never   Smokeless tobacco: Never  Vaping Use   Vaping status: Never Used  Substance Use Topics   Alcohol use: Not Currently    Comment: occ   Drug use: Never   "

## 2024-11-18 ENCOUNTER — Ambulatory Visit: Admitting: Rheumatology

## 2024-11-18 DIAGNOSIS — G8929 Other chronic pain: Secondary | ICD-10-CM

## 2024-11-18 DIAGNOSIS — Z79899 Other long term (current) drug therapy: Secondary | ICD-10-CM

## 2024-11-18 DIAGNOSIS — M7061 Trochanteric bursitis, right hip: Secondary | ICD-10-CM

## 2024-11-18 DIAGNOSIS — I10 Essential (primary) hypertension: Secondary | ICD-10-CM

## 2024-11-18 DIAGNOSIS — F5101 Primary insomnia: Secondary | ICD-10-CM

## 2024-11-18 DIAGNOSIS — K219 Gastro-esophageal reflux disease without esophagitis: Secondary | ICD-10-CM

## 2024-11-18 DIAGNOSIS — M1711 Unilateral primary osteoarthritis, right knee: Secondary | ICD-10-CM

## 2024-11-18 DIAGNOSIS — M797 Fibromyalgia: Secondary | ICD-10-CM

## 2024-11-18 DIAGNOSIS — R5383 Other fatigue: Secondary | ICD-10-CM

## 2024-11-18 DIAGNOSIS — M064 Inflammatory polyarthropathy: Secondary | ICD-10-CM

## 2024-11-19 ENCOUNTER — Ambulatory Visit: Admitting: Rheumatology

## 2024-11-19 NOTE — Progress Notes (Unsigned)
 "  Office Visit Note  Patient: Katie Branch             Date of Birth: 1971-02-10           MRN: [id number]             PCP: Maree Isles, MD Referring: Maree Isles, MD Visit Date: 11/21/2024 Occupation: Data Unavailable  Subjective:  No chief complaint on file.   History of Present Illness: Katie Branch is a 54 y.o. female ***     Activities of Daily Living:  Patient reports morning stiffness for *** {minute/hour:19697}.   Patient {ACTIONS;DENIES/REPORTS:21021675::Denies} nocturnal pain.  Difficulty dressing/grooming: {ACTIONS;DENIES/REPORTS:21021675::Denies} Difficulty climbing stairs: {ACTIONS;DENIES/REPORTS:21021675::Denies} Difficulty getting out of chair: {ACTIONS;DENIES/REPORTS:21021675::Denies} Difficulty using hands for taps, buttons, cutlery, and/or writing: {ACTIONS;DENIES/REPORTS:21021675::Denies}  No Rheumatology ROS completed.   PMFS History:  Patient Active Problem List   Diagnosis Date Noted   Essential hypertension 01/04/2017   Gastroesophageal reflux disease without esophagitis 01/04/2017   Fibromyalgia 01/03/2017   Other fatigue 01/03/2017   Primary insomnia 01/03/2017   Plantar fasciitis 01/03/2017    Past Medical History:  Diagnosis Date   Arthritis    Fatigue    Fibromyalgia    GERD (gastroesophageal reflux disease)    Hypertension    Plantar fasciitis     Family History  Problem Relation Age of Onset   Cancer Mother        pancreatic    Heart disease Father    COPD Father    Breast cancer Neg Hx    Past Surgical History:  Procedure Laterality Date   FLEXIBLE SIGMOIDOSCOPY  01/06/2021   Procedure: FLEXIBLE SIGMOIDOSCOPY;  Surgeon: Eartha Angelia Sieving, MD;  Location: [facility];  Service: Gastroenterology;;   FOOT SURGERY Left 11/25/2019   Social History[1] Social History   Social History Narrative   Not on file     Immunization History  Administered Date(s) Administered   Influenza Inj Mdck Quad Pf  08/20/2019   Influenza-Unspecified 08/28/2019   Moderna Sars-Covid-2 Vaccination 01/23/2020, 02/25/2020     Objective: Vital Signs: LMP 06/20/2017    Physical Exam   Musculoskeletal Exam: ***  CDAI Exam: CDAI Score: -- Patient Global: --; Provider Global: -- Swollen: --; Tender: -- Joint Exam 11/21/2024   No joint exam has been documented for this visit   There is currently no information documented on the homunculus. Go to the Rheumatology activity and complete the homunculus joint exam.  Investigation: No additional findings.  Imaging: No results found.  Recent Labs: Lab Results  Component Value Date   WBC 11.5 (H) 08/27/2023   HGB 14.2 08/27/2023   PLT 359 08/27/2023   NA 137 08/27/2023   K 3.4 (L) 08/27/2023   CL 103 08/27/2023   CO2 23 08/27/2023   GLUCOSE 148 (H) 08/27/2023   BUN 13 08/27/2023   CREATININE 0.97 08/27/2023   BILITOT 0.6 08/27/2023   ALKPHOS 96 08/27/2023   AST 17 08/27/2023   ALT 20 08/27/2023   PROT 7.6 08/27/2023   ALBUMIN 4.4 08/27/2023   CALCIUM 9.3 08/27/2023    Speciality Comments: PLQ eye exam: 07/31/2021 normal @ [facility]. Dr. George Glosson. Follow up in 1 year.  Procedures:  No procedures performed Allergies: Penicillins   Assessment / Plan:     Visit Diagnoses: No diagnosis found.  Orders: No orders of the defined types were placed in this encounter.  No orders of the defined types were placed in this encounter.   Face-to-face time  spent with patient was *** minutes. Greater than 50% of time was spent in counseling and coordination of care.  Follow-Up Instructions: No follow-ups on file.   Katie Branch, RT  Note - This record has been created using [employer].  Chart creation errors have been sought, but may not always  have been located. Such creation errors do not reflect on  the standard of medical care.    [1]  Social History Tobacco Use   Smoking status: Never    Passive exposure: Never    Smokeless tobacco: Never  Vaping Use   Vaping status: Never Used  Substance Use Topics   Alcohol use: Not Currently    Comment: occ   Drug use: Never   "

## 2024-11-19 NOTE — Progress Notes (Unsigned)
 "  Office Visit Note  Patient: Katie Branch             Date of Birth: [birth year]           MRN: [id number]             PCP: Maree Isles, MD Referring: Maree Isles, MD Visit Date: 11/19/2024 Occupation: Data Unavailable  Subjective:  No chief complaint on file.   History of Present Illness: Katie Branch is a 54 y.o. female ***     Activities of Daily Living:  Patient reports morning stiffness for *** {minute/hour:19697}.   Patient {ACTIONS;DENIES/REPORTS:21021675::Denies} nocturnal pain.  Difficulty dressing/grooming: {ACTIONS;DENIES/REPORTS:21021675::Denies} Difficulty climbing stairs: {ACTIONS;DENIES/REPORTS:21021675::Denies} Difficulty getting out of chair: {ACTIONS;DENIES/REPORTS:21021675::Denies} Difficulty using hands for taps, buttons, cutlery, and/or writing: {ACTIONS;DENIES/REPORTS:21021675::Denies}  No Rheumatology ROS completed.   PMFS History:  Patient Active Problem List   Diagnosis Date Noted   Essential hypertension 01/04/2017   Gastroesophageal reflux disease without esophagitis 01/04/2017   Fibromyalgia 01/03/2017   Other fatigue 01/03/2017   Primary insomnia 01/03/2017   Plantar fasciitis 01/03/2017    Past Medical History:  Diagnosis Date   Arthritis    Fatigue    Fibromyalgia    GERD (gastroesophageal reflux disease)    Hypertension    Plantar fasciitis     Family History  Problem Relation Age of Onset   Cancer Mother        pancreatic    Heart disease Father    COPD Father    Breast cancer Neg Hx    Past Surgical History:  Procedure Laterality Date   FLEXIBLE SIGMOIDOSCOPY  01/06/2021   Procedure: FLEXIBLE SIGMOIDOSCOPY;  Surgeon: Eartha Angelia Sieving, MD;  Location: [facility];  Service: Gastroenterology;;   FOOT SURGERY Left 11/25/2019   Social History[1] Social History   Social History Narrative   Not on file     Immunization History  Administered Date(s) Administered   Influenza Inj Mdck Quad Pf  08/20/2019   Influenza-Unspecified 08/28/2019   Moderna Sars-Covid-2 Vaccination 01/23/2020, 02/25/2020     Objective: Vital Signs: LMP 06/20/2017    Physical Exam   Musculoskeletal Exam: ***  CDAI Exam: CDAI Score: -- Patient Global: --; Provider Global: -- Swollen: --; Tender: -- Joint Exam 11/19/2024   No joint exam has been documented for this visit   There is currently no information documented on the homunculus. Go to the Rheumatology activity and complete the homunculus joint exam.  Investigation: No additional findings.  Imaging: No results found.  Recent Labs: Lab Results  Component Value Date   WBC 11.5 (H) 08/27/2023   HGB 14.2 08/27/2023   PLT 359 08/27/2023   NA 137 08/27/2023   K 3.4 (L) 08/27/2023   CL 103 08/27/2023   CO2 23 08/27/2023   GLUCOSE 148 (H) 08/27/2023   BUN 13 08/27/2023   CREATININE 0.97 08/27/2023   BILITOT 0.6 08/27/2023   ALKPHOS 96 08/27/2023   AST 17 08/27/2023   ALT 20 08/27/2023   PROT 7.6 08/27/2023   ALBUMIN 4.4 08/27/2023   CALCIUM 9.3 08/27/2023    Speciality Comments: PLQ eye exam: 07/31/2021 normal @ [facility]. Dr. George Glosson. Follow up in 1 year.  Procedures:  No procedures performed Allergies: Penicillins   Assessment / Plan:     Visit Diagnoses: No diagnosis found.  Orders: No orders of the defined types were placed in this encounter.  No orders of the defined types were placed in this encounter.   Face-to-face time  spent with patient was *** minutes. Greater than 50% of time was spent in counseling and coordination of care.  Follow-Up Instructions: No follow-ups on file.   Maya Nash, MD  Note - This record has been created using Animal nutritionist.  Chart creation errors have been sought, but may not always  have been located. Such creation errors do not reflect on  the standard of medical care.    [1]  Social History Tobacco Use   Smoking status: Never    Passive exposure: Never    Smokeless tobacco: Never  Vaping Use   Vaping status: Never Used  Substance Use Topics   Alcohol use: Not Currently    Comment: occ   Drug use: Never   "

## 2024-11-21 ENCOUNTER — Ambulatory Visit: Attending: Physician Assistant | Admitting: Physician Assistant

## 2024-11-21 ENCOUNTER — Encounter: Payer: Self-pay | Admitting: Physician Assistant

## 2024-11-21 VITALS — BP 136/82 | HR 69 | Temp 97.7°F | Resp 16 | Ht 65.0 in | Wt 240.8 lb

## 2024-11-21 DIAGNOSIS — M797 Fibromyalgia: Secondary | ICD-10-CM

## 2024-11-21 DIAGNOSIS — M1711 Unilateral primary osteoarthritis, right knee: Secondary | ICD-10-CM

## 2024-11-21 DIAGNOSIS — M533 Sacrococcygeal disorders, not elsewhere classified: Secondary | ICD-10-CM

## 2024-11-21 DIAGNOSIS — I10 Essential (primary) hypertension: Secondary | ICD-10-CM | POA: Diagnosis not present

## 2024-11-21 DIAGNOSIS — M7061 Trochanteric bursitis, right hip: Secondary | ICD-10-CM

## 2024-11-21 DIAGNOSIS — Z79899 Other long term (current) drug therapy: Secondary | ICD-10-CM

## 2024-11-21 DIAGNOSIS — M064 Inflammatory polyarthropathy: Secondary | ICD-10-CM

## 2024-11-21 DIAGNOSIS — F5101 Primary insomnia: Secondary | ICD-10-CM

## 2024-11-21 DIAGNOSIS — M25562 Pain in left knee: Secondary | ICD-10-CM | POA: Diagnosis not present

## 2024-11-21 DIAGNOSIS — R5383 Other fatigue: Secondary | ICD-10-CM

## 2024-11-21 DIAGNOSIS — M25512 Pain in left shoulder: Secondary | ICD-10-CM

## 2024-11-21 DIAGNOSIS — M7062 Trochanteric bursitis, left hip: Secondary | ICD-10-CM

## 2024-11-21 DIAGNOSIS — K219 Gastro-esophageal reflux disease without esophagitis: Secondary | ICD-10-CM

## 2024-11-21 DIAGNOSIS — G8929 Other chronic pain: Secondary | ICD-10-CM

## 2024-11-21 MED ORDER — PREDNISONE 5 MG PO TABS: ORAL_TABLET | ORAL | 0 refills | Status: AC

## 2024-11-21 MED ORDER — CYCLOBENZAPRINE HCL 5 MG PO TABS: 5.0000 mg | ORAL_TABLET | Freq: Every evening | ORAL | 0 refills | Status: AC | PRN

## 2024-11-22 ENCOUNTER — Ambulatory Visit: Admitting: Rheumatology

## 2025-05-12 ENCOUNTER — Ambulatory Visit: Admitting: Rheumatology
